# Patient Record
Sex: Male | Born: 1965 | Race: White | Hispanic: No | Marital: Married | Smoking: Former smoker
Health system: Southern US, Community
[De-identification: ages and names within clinical notes are randomized; demographics above are authoritative.]

## PROBLEM LIST (undated history)

## (undated) DIAGNOSIS — G4730 Sleep apnea, unspecified: Secondary | ICD-10-CM

## (undated) DIAGNOSIS — E7800 Pure hypercholesterolemia, unspecified: Secondary | ICD-10-CM

## (undated) HISTORY — PX: KNEE SURGERY: SHX244

## (undated) HISTORY — PX: HAND SURGERY: SHX662

---

## 2012-10-10 ENCOUNTER — Emergency Department (HOSPITAL_COMMUNITY): Payer: BC Managed Care – PPO

## 2012-10-10 ENCOUNTER — Emergency Department (HOSPITAL_COMMUNITY)
Admission: EM | Admit: 2012-10-10 | Discharge: 2012-10-10 | Disposition: A | Discharge status: Home / Self Care | Payer: BC Managed Care – PPO | Attending: Emergency Medicine | Admitting: Emergency Medicine

## 2012-10-10 ENCOUNTER — Encounter (HOSPITAL_COMMUNITY): Payer: Self-pay | Admitting: *Deleted

## 2012-10-10 DIAGNOSIS — F17200 Nicotine dependence, unspecified, uncomplicated: Secondary | ICD-10-CM | POA: Insufficient documentation

## 2012-10-10 DIAGNOSIS — Z79899 Other long term (current) drug therapy: Secondary | ICD-10-CM | POA: Insufficient documentation

## 2012-10-10 DIAGNOSIS — R4589 Other symptoms and signs involving emotional state: Secondary | ICD-10-CM | POA: Insufficient documentation

## 2012-10-10 DIAGNOSIS — E7800 Pure hypercholesterolemia, unspecified: Secondary | ICD-10-CM | POA: Insufficient documentation

## 2012-10-10 DIAGNOSIS — F411 Generalized anxiety disorder: Secondary | ICD-10-CM

## 2012-10-10 DIAGNOSIS — R0602 Shortness of breath: Secondary | ICD-10-CM | POA: Insufficient documentation

## 2012-10-10 DIAGNOSIS — R079 Chest pain, unspecified: Secondary | ICD-10-CM | POA: Insufficient documentation

## 2012-10-10 DIAGNOSIS — R42 Dizziness and giddiness: Secondary | ICD-10-CM | POA: Insufficient documentation

## 2012-10-10 HISTORY — DX: Pure hypercholesterolemia, unspecified: E78.00

## 2012-10-10 LAB — POCT I-STAT TROPONIN I: Troponin i, poc: 0 ng/mL (ref 0.00–0.08)

## 2012-10-10 LAB — BASIC METABOLIC PANEL
Calcium: 9.4 mg/dL (ref 8.4–10.5)
GFR calc non Af Amer: 78 mL/min — ABNORMAL LOW (ref >90)
Glucose, Bld: 142 mg/dL — ABNORMAL HIGH (ref 70–99)
Sodium: 142 mEq/L (ref 135–145)

## 2012-10-10 MED ORDER — ASPIRIN 81 MG PO CHEW
324.0000 mg | CHEWABLE_TABLET | Freq: Once | ORAL | Status: AC
  Administered 2012-10-10: 324 mg via ORAL
  Filled 2012-10-10: qty 4

## 2012-10-10 MED ORDER — IOHEXOL 350 MG/ML SOLN
100.0000 mL | Freq: Once | INTRAVENOUS | Status: AC | PRN
  Administered 2012-10-10: 100 mL via INTRAVENOUS

## 2012-10-10 NOTE — ED Provider Notes (Addendum)
History     CSN: [id number]  Arrival date & time 10/10/12  0628   None     Chief Complaint  Patient presents with  . Chest Pain  . Shortness of Breath  . Dizziness   HPI Brad Luna is a 47 y.o. male history of high cholesterol, daily smoker and smokeless tobacco user who presents with acute onset of chest pain.  Patient admits to a lot of stress recently and was ruminating on the things he needed to do early this morning before he got to work.  He had some tnigling in bilateral fingers at that time. 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 Is also associated with a pounding headache in the frontal region head has somewhat eased up. Patient is currently chest pain-free. Denies any recent illness, fever, chills, productive cough, hemoptysis, history of venous thromboembolic disease, unilateral leg swelling, abdominal pain, weakness.   Past Medical History  Diagnosis Date  . High cholesterol     Past Surgical History  Procedure Laterality Date  . Knee surgery      History reviewed. No pertinent family history.  History  Substance Use Topics  . Smoking status: Current Every Day Smoker    Types: Cigarettes  . Smokeless tobacco: Not on file  . Alcohol Use: Yes      Review of Systems At least 10pt or greater review of systems completed and are negative except where specified in the HPI.  Allergies  Review of patient's allergies indicates no known allergies.  Home Medications   Current Outpatient Rx  Name  Route  Sig  Dispense  Refill  . SIMVASTATIN PO   Oral   Take by mouth.           There were no vitals taken for this visit.  Physical Exam  Nursing notes reviewed.  Electronic medical record  reviewed. VITAL SIGNS:   Filed Vitals:   10/10/12 0647  BP: 135/75  Pulse: 108  Temp: 98.7 F (37.1 C)  TempSrc: Oral  Resp: 18  Height: 6\' 3"  (1.905 m)  Weight: 270 lb (122.471 kg)  SpO2: 95%   CONSTITUTIONAL: Awake, oriented, appears non-toxic, appears anxious HENT: Atraumatic, normocephalic, oral mucosa pink and moist, airway patent. Nares patent without drainage. External ears normal. EYES: Conjunctiva clear, EOMI, PERRLA NECK: Trachea midline, non-tender, supple CARDIOVASCULAR: Normal heart rate, Normal rhythm, No murmurs, rubs, gallops PULMONARY/CHEST: Clear to auscultation, no rhonchi, wheezes, or rales. Symmetrical breath sounds. Non-tender. ABDOMINAL: Non-distended, soft, non-tender - no rebound or guarding.  BS normal. NEUROLOGIC: Non-focal, moving all four extremities, no gross sensory or motor deficits. EXTREMITIES: No clubbing, cyanosis, or edema SKIN: Warm, Dry, No erythema, No rash  ED Course  Procedures (including critical care time)  Date: 10/10/2012  Rate: 77  Rhythm: normal sinus rhythm  QRS Axis: Questionable left axis deviation  Intervals: normal  ST/T Wave abnormalities: normal  Conduction Disutrbances: none  Narrative Interpretation: Low voltage QRS, borderline EKG, no acute ischemia or infarction, no right heart strain   Labs Reviewed  BASIC METABOLIC PANEL - Abnormal; Notable for the following:    Glucose, Bld 142 (*)    GFR calc non Af Amer 78 (*)    All other components within normal limits  PRO B NATRIURETIC PEPTIDE  POCT I-STAT TROPONIN I   Ct Angio Chest Pe W/cm &/or Wo Cm  10/10/2012   *RADIOLOGY REPORT*  Clinical Data: 47 year old male with hypoxia, dizziness, tachycardia, shortness of breath.  CT ANGIOGRAPHY CHEST  Technique:  Multidetector CT imaging of the chest using the standard protocol during bolus administration of intravenous contrast. Multiplanar reconstructed images including MIPs were obtained and reviewed to evaluate the  vascular anatomy.  Contrast: OMNIPAQUE IOHEXOL 350 MG/ML SOLN  Comparison: None.  Findings: Suboptimal contrast bolus timing in the pulmonary arterial tree.  No central pulmonary artery filling defect.  No hilar pulmonary artery filling defect.  Only mild respiratory motion artifact.  No convincing pulmonary artery thrombus.  Negative thoracic inlet.  Negative visualized aorta.  No pericardial effusion.  No definite pleural effusion.  No mediastinal lymphadenopathy.  Major airways are patent.  The lung bases are not entirely included.  Minor dependent atelectasis.  5-6 mm medial basal segment right lower lobe pulmonary nodule on series 6 image 51. Small III 4 mm subpleural nodule at the right lower lobe lateral basal segment (image 66).  Negative lung parenchyma elsewhere.  No acute osseous abnormality identified.  Negative visualized upper abdominal viscera.  IMPRESSION: 1.  Suboptimal contrast bolus timing but no convincing evidence of acute pulmonary embolus. 2.  Two right lower lobe pulmonary nodules measuring up to 6 mm. Follow-up chest CT is recommended in 1 year (Radiology 2005; 540:981-191 "Guidelines for Management of Small Pulmonary Nodules Detected on CT Scans:  A Statement from the Fleischner Society").   Original Report Authenticated By: Erskine Speed, M.D.     1. Chest pain   2. Shortness of breath   3. Dizzy   4. Anxiety in acute stress reaction       MDM  Brad Luna is a 47 y.o. male presenting with a constellation of symptoms, starting early this morning with some tingling and bilateral fingertips, as the day progressed, he also had acute onset of chest pain which lasted for approximately 10 minutes, felt like pressure, associated with dizziness, graying of his vision, dizziness which resolved without any interventions.  This is also associated with headache.  Symptoms are currently resolved. Patient does admit to being under stress.  Patient's symptom constellation suggestive of  panic and anxiety, however feel like this should be a diagnosis of exclusion. Initially, patient is tachycardic, with some borderline oxygen saturations - obtain a CT with PE protocol to rule out pulmonary embolism as PE was as likely as any other diagnosis, he was tachycardic and had O2 saturations in the low 90s on 2 L of oxygen but never required oxygen before. CT of his chest shows no acute pulmonary embolism, no acute intrathoracic structural abnormalities. Patient does have 2 right lower lobe pulmonary nodules, patient was informed of these nodules in the knee for followup with his primary care physician.  Patient's EKG is nonischemic, troponin is negative.  Patient's heart score is a 2.  I think this patient is of sufficient low risk to be followed up as an outpatient. Likewise there is no evidence of pericarditis, pneumothorax, pneumonia.    Patient's vital signs on reassessment are within normal limits. Patient is urged to followup with his primary care physician for further assessment, quit smoking.  I explained the diagnosis and have given explicit precautions to return to the ER including chest pains or any other new or worsening symptoms. The patient understands and accepts the medical plan as  it's been dictated and I have answered their questions. Discharge instructions concerning home care and prescriptions have been given.  The patient is STABLE and is discharged to home in good condition.    Jones Skene, MD 10/10/12 [id number]

## 2012-10-10 NOTE — ED Notes (Signed)
Pt c/o centralized chest pain described as chest pressure. Pt states he started seeing gray. Pt also c/o sob, lightheadedness, and a pounding headache.

## 2012-10-10 NOTE — ED Notes (Signed)
Patient with no complaints at this time. Respirations even and unlabored. Skin warm/dry. Discharge instructions reviewed with patient at this time. Patient given opportunity to voice concerns/ask questions. IV removed per policy and band-aid applied to site. Patient discharged at this time and left Emergency Department with steady gait.  

## 2012-10-15 DIAGNOSIS — R079 Chest pain, unspecified: Secondary | ICD-10-CM

## 2013-11-21 ENCOUNTER — Encounter (HOSPITAL_COMMUNITY): Payer: Self-pay | Admitting: Emergency Medicine

## 2013-11-21 ENCOUNTER — Emergency Department (HOSPITAL_COMMUNITY): Payer: BC Managed Care – PPO

## 2013-11-21 ENCOUNTER — Emergency Department (HOSPITAL_COMMUNITY)
Admission: EM | Admit: 2013-11-21 | Discharge: 2013-11-21 | Disposition: A | Discharge status: Home / Self Care | Payer: BC Managed Care – PPO | Attending: Emergency Medicine | Admitting: Emergency Medicine

## 2013-11-21 DIAGNOSIS — R55 Syncope and collapse: Secondary | ICD-10-CM

## 2013-11-21 DIAGNOSIS — Z79899 Other long term (current) drug therapy: Secondary | ICD-10-CM | POA: Insufficient documentation

## 2013-11-21 DIAGNOSIS — F17200 Nicotine dependence, unspecified, uncomplicated: Secondary | ICD-10-CM | POA: Insufficient documentation

## 2013-11-21 DIAGNOSIS — E7800 Pure hypercholesterolemia, unspecified: Secondary | ICD-10-CM | POA: Insufficient documentation

## 2013-11-21 LAB — COMPREHENSIVE METABOLIC PANEL
ALK PHOS: 63 U/L (ref 39–117)
ALT: 20 U/L (ref 0–53)
AST: 21 U/L (ref 0–37)
Albumin: 3.8 g/dL (ref 3.5–5.2)
Anion gap: 13 (ref 5–15)
BILIRUBIN TOTAL: 0.4 mg/dL (ref 0.3–1.2)
BUN: 17 mg/dL (ref 6–23)
CHLORIDE: 105 meq/L (ref 96–112)
CO2: 23 meq/L (ref 19–32)
Calcium: 8.6 mg/dL (ref 8.4–10.5)
Creatinine, Ser: 1.03 mg/dL (ref 0.50–1.35)
GFR, EST NON AFRICAN AMERICAN: 84 mL/min — AB (ref >90)
GLUCOSE: 105 mg/dL — AB (ref 70–99)
Potassium: 4.1 mEq/L (ref 3.7–5.3)
SODIUM: 141 meq/L (ref 137–147)
Total Protein: 6.5 g/dL (ref 6.0–8.3)

## 2013-11-21 LAB — RAPID URINE DRUG SCREEN, HOSP PERFORMED
AMPHETAMINES: NOT DETECTED
BENZODIAZEPINES: NOT DETECTED
Barbiturates: NOT DETECTED
Cocaine: NOT DETECTED
Opiates: NOT DETECTED
Tetrahydrocannabinol: NOT DETECTED

## 2013-11-21 LAB — CBC WITH DIFFERENTIAL/PLATELET
BASOS ABS: 0 10*3/uL (ref 0.0–0.1)
Basophils Relative: 0 % (ref 0–1)
EOS PCT: 1 % (ref 0–5)
Eosinophils Absolute: 0.1 10*3/uL (ref 0.0–0.7)
HEMATOCRIT: 46.2 % (ref 39.0–52.0)
Hemoglobin: 16 g/dL (ref 13.0–17.0)
LYMPHS ABS: 1.1 10*3/uL (ref 0.7–4.0)
LYMPHS PCT: 17 % (ref 12–46)
MCH: 31.4 pg (ref 26.0–34.0)
MCHC: 34.6 g/dL (ref 30.0–36.0)
MCV: 90.6 fL (ref 78.0–100.0)
Monocytes Absolute: 0.3 10*3/uL (ref 0.1–1.0)
Monocytes Relative: 5 % (ref 3–12)
NEUTROS ABS: 5.1 10*3/uL (ref 1.7–7.7)
Neutrophils Relative %: 77 % (ref 43–77)
PLATELETS: 159 10*3/uL (ref 150–400)
RBC: 5.1 MIL/uL (ref 4.22–5.81)
RDW: 13.3 % (ref 11.5–15.5)
WBC: 6.6 10*3/uL (ref 4.0–10.5)

## 2013-11-21 LAB — URINALYSIS, ROUTINE W REFLEX MICROSCOPIC
BILIRUBIN URINE: NEGATIVE
Glucose, UA: NEGATIVE mg/dL
Hgb urine dipstick: NEGATIVE
KETONES UR: NEGATIVE mg/dL
Leukocytes, UA: NEGATIVE
Nitrite: NEGATIVE
PH: 7 (ref 5.0–8.0)
PROTEIN: NEGATIVE mg/dL
Specific Gravity, Urine: 1.01 (ref 1.005–1.030)
Urobilinogen, UA: 0.2 mg/dL (ref 0.0–1.0)

## 2013-11-21 LAB — TROPONIN I: Troponin I: <0.3 ng/mL (ref <0.30)

## 2013-11-21 MED ORDER — SODIUM CHLORIDE 0.9 % IV BOLUS (SEPSIS)
1000.0000 mL | Freq: Once | INTRAVENOUS | Status: AC
  Administered 2013-11-21: 1000 mL via INTRAVENOUS

## 2013-11-21 NOTE — ED Notes (Signed)
Patient with no complaints at this time. Respirations even and unlabored. Skin warm/dry. Discharge instructions reviewed with patient at this time. Patient given opportunity to voice concerns/ask questions. IV removed per policy and band-aid applied to site. Patient discharged at this time and left Emergency Department with steady gait.  

## 2013-11-21 NOTE — ED Notes (Signed)
Patient arrives via EMS from home with c/o loss of consciousness. Patient was getting ready for work and stated to his wife that he didn't feel well and sat down. Wife states he passed out and wife assisted him to floor. Denies pain. Does not recall event other than sitting down.

## 2013-11-21 NOTE — ED Notes (Signed)
Patient was unconscious until EMS arrived to house. Approximately 7 minutes.

## 2013-11-21 NOTE — ED Provider Notes (Signed)
CSN: [id number]     Arrival date & time 11/21/13  1715 History   First MD Initiated Contact with Patient 11/21/13 1734   This chart was scribed for Maudry Diego, MD by Rosary Lively, ED scribe. This patient was seen in room [facility]12/[facility]12 and the patient's care was started at 5:38 PM.    Chief Complaint  Patient presents with  . Loss of Consciousness   Patient is a 48 y.o. male presenting with syncope. The history is provided by the patient and a relative. No language interpreter was used.  Loss of Consciousness Episode history:  Single Most recent episode:  Today Chronicity:  New Associated symptoms: no chest pain, no headaches and no seizures   Associated symptoms comment:  Pt felt light headed  HPI Comments:  Brad Luna is a 48 y.o. male who presents to the Emergency Department complaining of LOC, with associated symptoms of light headedness prior to episode of LOC. Relative reports that Pt eyes deviated to the right during episode of LOC, but pt did not shake. Pt denies pain or recent illness. Pt reports that he is a smoker, and that his father died of lung cancer. Pt reports that he had knee surgery on both knees. Pt states that his PCP is at [facility].    Past Medical History  Diagnosis Date  . High cholesterol    Past Surgical History  Procedure Laterality Date  . Knee surgery     No family history on file. History  Substance Use Topics  . Smoking status: Current Every Day Smoker    Types: Cigarettes  . Smokeless tobacco: Not on file  . Alcohol Use: Yes    Review of Systems  Constitutional: Negative for appetite change and fatigue.  HENT: Negative for congestion, ear discharge and sinus pressure.   Eyes: Negative for discharge.  Respiratory: Negative for cough.   Cardiovascular: Positive for syncope. Negative for chest pain.  Gastrointestinal: Negative for abdominal pain and diarrhea.  Genitourinary: Negative for frequency and hematuria.  Musculoskeletal:  Negative for back pain.  Skin: Negative for rash.  Neurological: Positive for syncope and light-headedness. Negative for seizures and headaches.  Psychiatric/Behavioral: Negative for hallucinations.      Allergies  Review of patient's allergies indicates no known allergies.  Home Medications   Prior to Admission medications   Medication Sig Start Date End Date Taking? Authorizing Provider  acetaminophen (TYLENOL) 500 MG tablet Take 1,000 mg by mouth every 6 (six) hours as needed for pain.    Historical Provider, MD  ibuprofen (ADVIL,MOTRIN) 800 MG tablet Take 800 mg by mouth every 8 (eight) hours as needed for pain.    Historical Provider, MD  Omeprazole (PRILOSEC PO) Take 1 tablet by mouth daily as needed (acid).    Historical Provider, MD  SIMVASTATIN PO Take by mouth.    Historical Provider, MD   BP 129/95  Pulse 85  Temp(Src) 97.7 F (36.5 C) (Oral)  Resp 18  Ht 6\' 3"  (1.905 m)  Wt 258 lb (117.028 kg)  BMI 32.25 kg/m2  SpO2 93% Physical Exam  Constitutional: He is oriented to person, place, and time. He appears well-developed.  HENT:  Head: Normocephalic.  Eyes: Conjunctivae and EOM are normal. No scleral icterus.  Neck: Neck supple. No thyromegaly present.  Cardiovascular: Normal rate and regular rhythm.  Exam reveals no gallop and no friction rub.   No murmur heard. Pulmonary/Chest: No stridor. He has no wheezes. He has no rales. He exhibits  no tenderness.  Abdominal: He exhibits no distension. There is no tenderness. There is no rebound.  Musculoskeletal: Normal range of motion. He exhibits no edema.  Lymphadenopathy:    He has no cervical adenopathy.  Neurological: He is oriented to person, place, and time. He exhibits normal muscle tone. Coordination normal.  Skin: No rash noted. No erythema.  Psychiatric: He has a normal mood and affect. His behavior is normal.    ED Course  Procedures  DIAGNOSTIC STUDIES: Oxygen Saturation is 93% on RA, low by my  interpretation.  COORDINATION OF CARE: 5:41 PM-Discussed treatment plan with pt at bedside and pt agreed to plan. Results for orders placed during the hospital encounter of 11/21/13  CBC WITH DIFFERENTIAL      Result Value Ref Range   WBC 6.6  4.0 - 10.5 K/uL   RBC 5.10  4.22 - 5.81 MIL/uL   Hemoglobin 16.0  13.0 - 17.0 g/dL   HCT 46.2  39.0 - 52.0 %   MCV 90.6  78.0 - 100.0 fL   MCH 31.4  26.0 - 34.0 pg   MCHC 34.6  30.0 - 36.0 g/dL   RDW 13.3  11.5 - 15.5 %   Platelets 159  150 - 400 K/uL   Neutrophils Relative % 77  43 - 77 %   Neutro Abs 5.1  1.7 - 7.7 K/uL   Lymphocytes Relative 17  12 - 46 %   Lymphs Abs 1.1  0.7 - 4.0 K/uL   Monocytes Relative 5  3 - 12 %   Monocytes Absolute 0.3  0.1 - 1.0 K/uL   Eosinophils Relative 1  0 - 5 %   Eosinophils Absolute 0.1  0.0 - 0.7 K/uL   Basophils Relative 0  0 - 1 %   Basophils Absolute 0.0  0.0 - 0.1 K/uL  COMPREHENSIVE METABOLIC PANEL      Result Value Ref Range   Sodium 141  137 - 147 mEq/L   Potassium 4.1  3.7 - 5.3 mEq/L   Chloride 105  96 - 112 mEq/L   CO2 23  19 - 32 mEq/L   Glucose, Bld 105 (*) 70 - 99 mg/dL   BUN 17  6 - 23 mg/dL   Creatinine, Ser 1.03  0.50 - 1.35 mg/dL   Calcium 8.6  8.4 - 10.5 mg/dL   Total Protein 6.5  6.0 - 8.3 g/dL   Albumin 3.8  3.5 - 5.2 g/dL   AST 21  0 - 37 U/L   ALT 20  0 - 53 U/L   Alkaline Phosphatase 63  39 - 117 U/L   Total Bilirubin 0.4  0.3 - 1.2 mg/dL   GFR calc non Af Amer 84 (*) >90 mL/min   GFR calc Af Amer >90  >90 mL/min   Anion gap 13  5 - 15  TROPONIN I      Result Value Ref Range   Troponin I <0.30  <0.30 ng/mL  URINALYSIS, ROUTINE W REFLEX MICROSCOPIC      Result Value Ref Range   Color, Urine YELLOW  YELLOW   APPearance CLEAR  CLEAR   Specific Gravity, Urine 1.010  1.005 - 1.030   pH 7.0  5.0 - 8.0   Glucose, UA NEGATIVE  NEGATIVE mg/dL   Hgb urine dipstick NEGATIVE  NEGATIVE   Bilirubin Urine NEGATIVE  NEGATIVE   Ketones, ur NEGATIVE  NEGATIVE mg/dL   Protein,  ur NEGATIVE  NEGATIVE mg/dL   Urobilinogen, UA  0.2  0.0 - 1.0 mg/dL   Nitrite NEGATIVE  NEGATIVE   Leukocytes, UA NEGATIVE  NEGATIVE  URINE RAPID DRUG SCREEN (HOSP PERFORMED)      Result Value Ref Range   Opiates NONE DETECTED  NONE DETECTED   Cocaine NONE DETECTED  NONE DETECTED   Benzodiazepines NONE DETECTED  NONE DETECTED   Amphetamines NONE DETECTED  NONE DETECTED   Tetrahydrocannabinol NONE DETECTED  NONE DETECTED   Barbiturates NONE DETECTED  NONE DETECTED      EKG Interpretation None      MDM   Final diagnoses:  None   Syncope,  Nl studies pt to follow up with pcp,  Also follow up with pcp about small lung nodule  The chart was scribed for me under my direct supervision.  I personally performed the history, physical, and medical decision making and all procedures in the evaluation of this patient.Maudry Diego, MD 11/21/13 2100

## 2013-11-21 NOTE — Discharge Instructions (Signed)
Follow up with your md next week for recheck.  Return if any problems

## 2015-03-13 ENCOUNTER — Encounter (HOSPITAL_COMMUNITY): Payer: Self-pay | Admitting: *Deleted

## 2015-03-13 ENCOUNTER — Emergency Department (HOSPITAL_COMMUNITY): Payer: Worker's Compensation

## 2015-03-13 ENCOUNTER — Emergency Department (HOSPITAL_COMMUNITY)
Admission: EM | Admit: 2015-03-13 | Discharge: 2015-03-13 | Disposition: A | Discharge status: Home / Self Care | Payer: Worker's Compensation | Attending: Emergency Medicine | Admitting: Emergency Medicine

## 2015-03-13 DIAGNOSIS — Y998 Other external cause status: Secondary | ICD-10-CM | POA: Diagnosis not present

## 2015-03-13 DIAGNOSIS — Y9389 Activity, other specified: Secondary | ICD-10-CM | POA: Diagnosis not present

## 2015-03-13 DIAGNOSIS — E7800 Pure hypercholesterolemia, unspecified: Secondary | ICD-10-CM | POA: Insufficient documentation

## 2015-03-13 DIAGNOSIS — Z79899 Other long term (current) drug therapy: Secondary | ICD-10-CM | POA: Diagnosis not present

## 2015-03-13 DIAGNOSIS — S63602A Unspecified sprain of left thumb, initial encounter: Secondary | ICD-10-CM | POA: Diagnosis not present

## 2015-03-13 DIAGNOSIS — X58XXXA Exposure to other specified factors, initial encounter: Secondary | ICD-10-CM | POA: Diagnosis not present

## 2015-03-13 DIAGNOSIS — Y9289 Other specified places as the place of occurrence of the external cause: Secondary | ICD-10-CM | POA: Diagnosis not present

## 2015-03-13 DIAGNOSIS — S6992XA Unspecified injury of left wrist, hand and finger(s), initial encounter: Secondary | ICD-10-CM | POA: Diagnosis present

## 2015-03-13 DIAGNOSIS — Z720 Tobacco use: Secondary | ICD-10-CM | POA: Insufficient documentation

## 2015-03-13 NOTE — ED Notes (Signed)
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

## 2015-03-13 NOTE — Discharge Instructions (Signed)
Finger Sprain A finger sprain is a tear in one of the strong, fibrous tissues that connect the bones (ligaments) in your finger. The severity of the sprain depends on how much of the ligament is torn. The tear can be either partial or complete. CAUSES  Often, sprains are a result of a fall or accident. If you extend your hands to catch an object or to protect yourself, the force of the impact causes the fibers of your ligament to stretch too much. This excess tension causes the fibers of your ligament to tear. SYMPTOMS  You may have some loss of motion in your finger. Other symptoms include:  Bruising.  Tenderness.  Swelling. DIAGNOSIS  In order to diagnose finger sprain, your caregiver will physically examine your finger or thumb to determine how torn the ligament is. Your caregiver may also suggest an X-ray exam of your finger to make sure no bones are broken. TREATMENT  If your ligament is only partially torn, treatment usually involves keeping the finger in a fixed position (immobilization) for a short period. To do this, your caregiver will apply a bandage, cast, or splint to keep your finger from moving until it heals. For a partially torn ligament, the healing process usually takes 2 to 3 weeks. If your ligament is completely torn, you may need surgery to reconnect the ligament to the bone. After surgery a cast or splint will be applied and will need to stay on your finger or thumb for 4 to 6 weeks while your ligament heals. HOME CARE INSTRUCTIONS  Keep your injured finger elevated, when possible, to decrease swelling.  To ease pain and swelling, apply ice to your joint twice a day, for 2 to 3 days:  Put ice in a plastic bag.  Place a towel between your skin and the bag.  Leave the ice on for 15 minutes.  Only take over-the-counter or prescription medicine for pain as directed by your caregiver.  Do not wear rings on your injured finger.  Do not leave your finger unprotected  until pain and stiffness go away (usually 3 to 4 weeks).  Do not allow your cast or splint to get wet. Cover your cast or splint with a plastic bag when you shower or bathe. Do not swim.  Your caregiver may suggest special exercises for you to do during your recovery to prevent or limit permanent stiffness. SEEK IMMEDIATE MEDICAL CARE IF:  Your cast or splint becomes damaged.  Your pain becomes worse rather than better. MAKE SURE YOU:  Understand these instructions.  Will watch your condition.  Will get help right away if you are not doing well or get worse.   This information is not intended to replace advice given to you by your health care provider. Make sure you discuss any questions you have with your health care provider.   Document Released: 06/01/2004 Document Revised: 05/15/2014 Document Reviewed: 12/26/2010 Elsevier Interactive Patient Education 2016 [employer]  

## 2015-03-13 NOTE — ED Provider Notes (Signed)
CSN: [id number]     Arrival date & time 03/13/15  1746 History   None    Chief Complaint  Patient presents with  . Finger Injury     (Consider location/radiation/quality/duration/timing/severity/associated sxs/prior Treatment) Patient is a 49 y.o. male presenting with hand pain. The history is provided by the patient. No language interpreter was used.  Hand Pain This is a new problem. The current episode started today. The problem occurs constantly. The problem has been unchanged. Associated symptoms include joint swelling and myalgias. Nothing aggravates the symptoms. He has tried nothing for the symptoms. The treatment provided no relief.   Pt complains of pain in his left thumb.  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  Pt thinks thumb may have been twisted, now painful,  Loose, Past Medical History  Diagnosis Date  . High cholesterol    Past Surgical History  Procedure Laterality Date  . Knee surgery     No family history on file. Social History  Substance Use Topics  . Smoking status: Current Every Day Smoker    Types: Cigarettes  . Smokeless tobacco: None  . Alcohol Use: Yes     Comment: Occ    Review of Systems  Musculoskeletal: Positive for myalgias and joint swelling.  All other systems reviewed and are negative.     Allergies  Review of patient's allergies indicates no known allergies.  Home Medications   Prior to Admission medications   Medication Sig Start Date End Date Taking? Authorizing Provider  acetaminophen (TYLENOL) 500 MG tablet Take 1,000 mg by mouth every 6 (six) hours as needed for pain.    Historical Provider, MD  buPROPion (WELLBUTRIN SR) 150 MG 12 hr tablet Take 150 mg by mouth 2 (two) times daily. 10/08/13   Historical Provider, MD  ibuprofen (ADVIL,MOTRIN) 200 MG tablet Take 200 mg by mouth every 6 (six) hours as needed for mild pain or moderate pain.    Historical Provider, MD  simvastatin (ZOCOR) 10 MG tablet Take 10 mg  by mouth at bedtime.    Historical Provider, MD   BP 150/96 mmHg  Pulse 106  Temp(Src) 98.2 F (36.8 C) (Oral)  Resp 16  Ht 6\' 3"  (1.905 m)  Wt 265 lb (120.203 kg)  BMI 33.12 kg/m2  SpO2 99% Physical Exam  Constitutional: He is oriented to person, place, and time. He appears well-developed and well-nourished.  Musculoskeletal: He exhibits tenderness.  Slight swelling at mcp.  From ns and nv intact.  Pt has increased lateral laxity compared with right.    Neurological: He is alert and oriented to person, place, and time. He has normal reflexes.  Skin: Skin is warm.  Psychiatric: He has a normal mood and affect.  Nursing note and vitals reviewed.   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 EXAM: LEFT THUMB 2+V COMPARISON:  None. FINDINGS: There is no evidence of fracture or dislocation. There is no evidence of arthropathy or other focal bone abnormality. Soft tissues are unremarkable IMPRESSION: Negative. Electronically Signed   By: Kathreen Devoid   On: 03/13/2015 18:14   I have personally reviewed and evaluated these images and lab results as part of my medical decision-making.   EKG Interpretation None      MDM   Final diagnoses:  Thumb  sprain, left, initial encounter    Pt placed in splint. Pt advise to follow up with Dr. Burney Gauze for evaluation next week.    Hollace Kinnier [place], PA-C 03/13/15 2010  Virgel Manifold, MD 03/13/15 [phone]

## 2015-06-08 NOTE — ED Notes (Signed)
Work note faxed to ED from Dr Edrick Oh office. Called pt to inform him and to see if he needed a copy. He stated he had a copy and to shred our copy. Copy of work noted placed in shred box.

## 2015-11-17 ENCOUNTER — Encounter (INDEPENDENT_AMBULATORY_CARE_PROVIDER_SITE_OTHER): Payer: Self-pay | Admitting: *Deleted

## 2015-12-15 ENCOUNTER — Encounter (INDEPENDENT_AMBULATORY_CARE_PROVIDER_SITE_OTHER): Payer: Self-pay

## 2015-12-15 ENCOUNTER — Encounter (INDEPENDENT_AMBULATORY_CARE_PROVIDER_SITE_OTHER): Payer: Self-pay | Admitting: *Deleted

## 2015-12-16 ENCOUNTER — Other Ambulatory Visit (INDEPENDENT_AMBULATORY_CARE_PROVIDER_SITE_OTHER): Payer: Self-pay | Admitting: *Deleted

## 2015-12-16 DIAGNOSIS — Z1211 Encounter for screening for malignant neoplasm of colon: Secondary | ICD-10-CM

## 2016-02-10 ENCOUNTER — Telehealth (INDEPENDENT_AMBULATORY_CARE_PROVIDER_SITE_OTHER): Payer: Self-pay | Admitting: *Deleted

## 2016-02-10 ENCOUNTER — Encounter (INDEPENDENT_AMBULATORY_CARE_PROVIDER_SITE_OTHER): Payer: Self-pay | Admitting: *Deleted

## 2016-02-10 NOTE — Telephone Encounter (Signed)
Patient needs trilyte 

## 2016-02-11 MED ORDER — PEG 3350-KCL-NA BICARB-NACL 420 G PO SOLR: 4000.0000 mL | Freq: Once | ORAL | 0 refills | Status: AC

## 2016-03-01 ENCOUNTER — Telehealth (INDEPENDENT_AMBULATORY_CARE_PROVIDER_SITE_OTHER): Payer: Self-pay | Admitting: *Deleted

## 2016-03-01 NOTE — Telephone Encounter (Signed)
Referring MD/PCP: daniel   Procedure: Brad Luna  Reason/Indication:  tcs  Has patient had this procedure before?  screening  If so, when, by whom and where?    Is there a family history of colon cancer?  no  Who?  What age when diagnosed?    Is patient diabetic?   no      Does patient have prosthetic heart valve or mechanical valve?  no  Do you have a pacemaker?  no  Has patient ever had endocarditis? no  Has patient had joint replacement within last 12 months?  no  Does patient tend to be constipated or take laxatives? no  Does patient have a history of alcohol/drug use?  no  Is patient on Coumadin, Plavix and/or Aspirin? no  Medications: simvastatin 40 mg daily, bupropion 150 mg bid  Allergies: nkda  Medication Adjustment:   Procedure date & time: 03/23/16 at 1030

## 2016-03-01 NOTE — Telephone Encounter (Signed)
agree

## 2016-03-23 ENCOUNTER — Ambulatory Visit (HOSPITAL_COMMUNITY)
Admission: RE | Admit: 2016-03-23 | Discharge: 2016-03-23 | Disposition: A | Discharge status: Home / Self Care | Payer: BLUE CROSS/BLUE SHIELD | Source: Ambulatory Visit | Attending: Internal Medicine | Admitting: Internal Medicine

## 2016-03-23 ENCOUNTER — Encounter (HOSPITAL_COMMUNITY)
Admission: RE | Disposition: A | Discharge status: Home / Self Care | Payer: Self-pay | Source: Ambulatory Visit | Attending: Internal Medicine

## 2016-03-23 ENCOUNTER — Encounter (HOSPITAL_COMMUNITY): Payer: Self-pay | Admitting: *Deleted

## 2016-03-23 DIAGNOSIS — D122 Benign neoplasm of ascending colon: Secondary | ICD-10-CM | POA: Insufficient documentation

## 2016-03-23 DIAGNOSIS — Z79899 Other long term (current) drug therapy: Secondary | ICD-10-CM | POA: Insufficient documentation

## 2016-03-23 DIAGNOSIS — Z1211 Encounter for screening for malignant neoplasm of colon: Secondary | ICD-10-CM

## 2016-03-23 DIAGNOSIS — K648 Other hemorrhoids: Secondary | ICD-10-CM

## 2016-03-23 DIAGNOSIS — E7800 Pure hypercholesterolemia, unspecified: Secondary | ICD-10-CM | POA: Diagnosis not present

## 2016-03-23 DIAGNOSIS — F17210 Nicotine dependence, cigarettes, uncomplicated: Secondary | ICD-10-CM | POA: Insufficient documentation

## 2016-03-23 DIAGNOSIS — K5730 Diverticulosis of large intestine without perforation or abscess without bleeding: Secondary | ICD-10-CM | POA: Insufficient documentation

## 2016-03-23 HISTORY — PX: COLONOSCOPY: SHX5424

## 2016-03-23 HISTORY — DX: Pure hypercholesterolemia, unspecified: E78.00

## 2016-03-23 HISTORY — DX: Sleep apnea, unspecified: G47.30

## 2016-03-23 SURGERY — COLONOSCOPY
Anesthesia: Moderate Sedation

## 2016-03-23 MED ORDER — SIMETHICONE 40 MG/0.6ML PO SUSP
ORAL | Status: DC | PRN
  Administered 2016-03-23: 10:00:00

## 2016-03-23 MED ORDER — MIDAZOLAM HCL 5 MG/5ML IJ SOLN
INTRAMUSCULAR | Status: AC
  Filled 2016-03-23: qty 10

## 2016-03-23 MED ORDER — SODIUM CHLORIDE 0.9 % IV SOLN
INTRAVENOUS | Status: DC
  Administered 2016-03-23: 10:00:00 via INTRAVENOUS

## 2016-03-23 MED ORDER — MEPERIDINE HCL 50 MG/ML IJ SOLN
INTRAMUSCULAR | Status: DC | PRN
  Administered 2016-03-23 (×2): 25 mg via INTRAVENOUS

## 2016-03-23 MED ORDER — MIDAZOLAM HCL 5 MG/5ML IJ SOLN
INTRAMUSCULAR | Status: DC | PRN
Start: 2016-03-23 — End: 2016-03-23
  Administered 2016-03-23: 1 mg via INTRAVENOUS
  Administered 2016-03-23 (×2): 2 mg via INTRAVENOUS
  Administered 2016-03-23: 3 mg via INTRAVENOUS

## 2016-03-23 MED ORDER — MEPERIDINE HCL 50 MG/ML IJ SOLN
INTRAMUSCULAR | Status: AC
  Filled 2016-03-23: qty 1

## 2016-03-23 NOTE — H&P (Signed)
Brad Luna is an 50 y.o. male.   Chief Complaint: Patient is here for colonoscopy. HPI: Patient is 50 year old Caucasian male who is here for screening colonoscopy. He denies abdominal pain change in bowel habits or rectal bleeding. Family history is negative for CRC.  Past Medical History:  Diagnosis Date  . High cholesterol   . Hypercholesteremia   . Sleep apnea     Past Surgical History:  Procedure Laterality Date  . HAND SURGERY Left   . KNEE SURGERY      History reviewed. No pertinent family history. Social History:  reports that he has been smoking Cigarettes.  He uses smokeless tobacco. He reports that he drinks alcohol. He reports that he does not use drugs.  Allergies: No Known Allergies  Medications Prior to Admission  Medication Sig Dispense Refill  . buPROPion (WELLBUTRIN SR) 150 MG 12 hr tablet Take 150 mg by mouth 2 (two) times daily.    Marland Kitchen ibuprofen (ADVIL,MOTRIN) 200 MG tablet Take 800 mg by mouth every 6 (six) hours as needed for headache or moderate pain.    Marland Kitchen omeprazole (PRILOSEC OTC) 20 MG tablet Take 20 mg by mouth daily as needed (acid reflux).    . sildenafil (REVATIO) 20 MG tablet Take 100 mg by mouth daily as needed (erectile dysfunction).   3  . simvastatin (ZOCOR) 40 MG tablet Take 40 mg by mouth at bedtime.  3    No results found for this or any previous visit (from the past 48 hour(s)). No results found.  ROS  Blood pressure (!) 131/95, pulse 77, temperature 98.8 F (37.1 C), temperature source Oral, resp. rate 15, height 6\' 3"  (1.905 m), weight 265 lb (120.2 kg), SpO2 97 %. Physical Exam  Constitutional: He appears well-developed and well-nourished.  HENT:  Mouth/Throat: Oropharynx is clear and moist.  Eyes: Conjunctivae are normal. No scleral icterus.  Neck: No thyromegaly present.  Cardiovascular: Normal rate, regular rhythm and normal heart sounds.   No murmur heard. Respiratory: Effort normal and breath sounds normal.  GI:  Abdomen  is full but soft and nontender without organomegaly or masses.  Musculoskeletal: He exhibits no edema.  Lymphadenopathy:    He has no cervical adenopathy.  Neurological: He is alert.  Skin: Skin is warm and dry.  He has a tattoo over left arm and left ankle.     Assessment/Plan Average risk screening colonoscopy.  Hildred Laser, MD 03/23/2016, 9:49 AM

## 2016-03-23 NOTE — Op Note (Signed)
[facility] Patient Name: Brad Luna Procedure Date: 03/23/2016 9:30 AM MRN: [id number] Date of Birth: [birth year] Attending MD: Hildred Laser , MD CSN: [id number] Age: 50 Admit Type: Outpatient Procedure:                Colonoscopy Indications:              Screening for colorectal malignant neoplasm Providers:                Hildred Laser, MD, Hinton Rao, RN, Charlyne Petrin RN, RN, Randa Spike, Technician Referring MD:             Mitzie Na. Quillian Quince, MD Medicines:                Meperidine 50 mg IV, Midazolam 8 mg IV Complications:            No immediate complications. Estimated Blood Loss:     Estimated blood loss was minimal. Procedure:                Pre-Anesthesia Assessment:                           - Prior to the procedure, a History and Physical                            was performed, and patient medications and                            allergies were reviewed. The patient's tolerance of                            previous anesthesia was also reviewed. The risks                            and benefits of the procedure and the sedation                            options and risks were discussed with the patient.                            All questions were answered, and informed consent                            was obtained. Prior Anticoagulants: The patient                            last took ibuprofen 7 days prior to the procedure.                            ASA Grade Assessment: II - A patient with mild                            systemic disease. After reviewing the risks and  benefits, the patient was deemed in satisfactory                            condition to undergo the procedure.                           After obtaining informed consent, the colonoscope                            was passed under direct vision. Throughout the                            procedure, the patient's blood  pressure, pulse, and                            oxygen saturations were monitored continuously. The                            [id number]) scope was introduced through                            the anus and advanced to the the cecum, identified                            by appendiceal orifice and ileocecal valve. The                            colonoscopy was performed without difficulty. The                            patient tolerated the procedure well. The quality                            of the bowel preparation was good. The ileocecal                            valve, appendiceal orifice, and rectum were                            photographed. Scope In: 9:58:37 AM Scope Out: 10:20:49 AM Scope Withdrawal Time: 0 hours 8 minutes 44 seconds  Total Procedure Duration: 0 hours 22 minutes 12 seconds  Findings:      A 4 mm polyp was found in the ascending colon. The polyp was sessile.       The polyp was removed with a cold snare. Resection and retrieval were       complete.      A few medium-mouthed diverticula were found in the sigmoid colon.      Internal hemorrhoids were found during retroflexion. The hemorrhoids       were small. Impression:               - One 4 mm polyp in the ascending colon, removed  with a cold snare. Resected and retrieved.                           - Diverticulosis in the sigmoid colon.                           - Internal hemorrhoids. Moderate Sedation:      Moderate (conscious) sedation was administered by the endoscopy nurse       and supervised by the endoscopist. The following parameters were       monitored: oxygen saturation, heart rate, blood pressure, CO2       capnography and response to care. Total physician intraservice time was       27 minutes. Recommendation:           - Patient has a contact number available for                            emergencies. The signs and symptoms of potential                             delayed complications were discussed with the                            patient. Return to normal activities tomorrow.                            Written discharge instructions were provided to the                            patient.                           - High fiber diet today.                           - Continue present medications.                           - Await pathology results.                           - Repeat colonoscopy for surveillance based on                            pathology results. Procedure Code(s):        --- Professional ---                           [phone], Colonoscopy, flexible; with removal of                            tumor(s), polyp(s), or other lesion(s) by snare                            technique  [id number], Moderate sedation services provided by the                            same physician or other qualified health care                            professional performing the diagnostic or                            therapeutic service that the sedation supports,                            requiring the presence of an independent trained                            observer to assist in the monitoring of the                            patient's level of consciousness and physiological                            status; initial 15 minutes of intraservice time,                            patient age 66 years or older                           [phone], Moderate sedation services; each additional                            15 minutes intraservice time Diagnosis Code(s):        --- Professional ---                           Z12.11, Encounter for screening for malignant                            neoplasm of colon                           D12.2, Benign neoplasm of ascending colon                           K64.8, Other hemorrhoids                           K57.30, Diverticulosis of large intestine without                             perforation or abscess without bleeding CPT copyright 2016 American Medical Association. All rights reserved. The codes documented in this report are preliminary and upon coder review may  be revised to meet current compliance requirements. Hildred Laser, MD Hildred Laser, MD 03/23/2016 10:27:38 AM This report has been signed electronically. Number of Addenda: 0

## 2016-03-23 NOTE — Discharge Instructions (Signed)
Resume usual medications and high fiber diet. No driving for 24 hours. Physician will call with biopsy results.     Colonoscopy, Adult, Care After This sheet gives you information about how to care for yourself after your procedure. Your doctor may also give you more specific instructions. If you have problems or questions, call your doctor. Follow these instructions at home: General instructions  For the first 24 hours after the procedure:  Do not drive or use machinery.  Do not sign important documents.  Do not drink alcohol.  Do your daily activities more slowly than normal.  Eat foods that are soft and easy to digest.  Rest often.  Take over-the-counter or prescription medicines only as told by your doctor.  It is up to you to get the results of your procedure. Ask your doctor, or the department performing the procedure, when your results will be ready. To help cramping and bloating:  Try walking around.  Put heat on your belly (abdomen) as told by your doctor. Use a heat source that your doctor recommends, such as a moist heat pack or a heating pad.  Put a towel between your skin and the heat source.  Leave the heat on for 20-30 minutes.  Remove the heat if your skin turns bright red. This is especially important if you cannot feel pain, heat, or cold. You can get burned. Eating and drinking  Drink enough fluid to keep your pee (urine) clear or pale yellow.  Return to your normal diet as told by your doctor. Avoid heavy or fried foods that are hard to digest.  Avoid drinking alcohol for as long as told by your doctor. Contact a doctor if:  You have blood in your poop (stool) 2-3 days after the procedure. Get help right away if:  You have more than a small amount of blood in your poop.  You see large clumps of tissue (blood clots) in your poop.  Your belly is swollen.  You feel sick to your stomach (nauseous).  You throw up (vomit).  You have a  fever.  You have belly pain that gets worse, and medicine does not help your pain. This information is not intended to replace advice given to you by your health care provider. Make sure you discuss any questions you have with your health care provider. Document Released: 05/27/2010 Document Revised: 01/17/2016 Document Reviewed: 01/17/2016 Elsevier Interactive Patient Education  2017 [place].   Colon Polyps Introduction Polyps are tissue growths inside the body. Polyps can grow in many places, including the large intestine (colon). A polyp may be a round bump or a mushroom-shaped growth. You could have one polyp or several. Most colon polyps are noncancerous (benign). However, some colon polyps can become cancerous over time. What are the causes? The exact cause of colon polyps is not known. What increases the risk? This condition is more likely to develop in people who:  Have a family history of colon cancer or colon polyps.  Are older than 24 or older than 45 if they are African American.  Have inflammatory bowel disease, such as ulcerative colitis or Crohn disease.  Are overweight.  Smoke cigarettes.  Do not get enough exercise.  Drink too much alcohol.  Eat a diet that is:  High in fat and red meat.  Low in fiber.  Had childhood cancer that was treated with abdominal radiation. What are the signs or symptoms? Most polyps do not cause symptoms. If you have symptoms, they  may include:  Blood coming from your rectum when having a bowel movement.  Blood in your stool.The stool may look dark red or black.  A change in bowel habits, such as constipation or diarrhea. How is this diagnosed? This condition is diagnosed with a colonoscopy. This is a procedure that uses a lighted, flexible scope to look at the inside of your colon. How is this treated? Treatment for this condition involves removing any polyps that are found. Those polyps will then be tested for  cancer. If cancer is found, your health care provider will talk to you about options for colon cancer treatment. Follow these instructions at home: Diet  Eat plenty of fiber, such as fruits, vegetables, and whole grains.  Eat foods that are high in calcium and vitamin D, such as milk, cheese, yogurt, eggs, liver, fish, and broccoli.  Limit foods high in fat, red meats, and processed meats, such as hot dogs, sausage, bacon, and lunch meats.  Maintain a healthy weight, or lose weight if recommended by your health care provider. General instructions  Do not smoke cigarettes.  Do not drink alcohol excessively.  Keep all follow-up visits as told by your health care provider. This is important. This includes keeping regularly scheduled colonoscopies. Talk to your health care provider about when you need a colonoscopy.  Exercise every day or as told by your health care provider. Contact a health care provider if:  You have new or worsening bleeding during a bowel movement.  You have new or increased blood in your stool.  You have a change in bowel habits.  You unexpectedly lose weight. This information is not intended to replace advice given to you by your health care provider. Make sure you discuss any questions you have with your health care provider. Document Released: 01/19/2004 Document Revised: 09/30/2015 Document Reviewed: 03/15/2015  2017 Elsevier    Diverticulosis Diverticulosis is the condition that develops when small pouches (diverticula) form in the wall of your colon. Your colon, or large intestine, is where water is absorbed and stool is formed. The pouches form when the inside layer of your colon pushes through weak spots in the outer layers of your colon. CAUSES  No one knows exactly what causes diverticulosis. RISK FACTORS  Being older than 67. Your risk for this condition increases with age. Diverticulosis is rare in people younger than 40 years. By age 79,  almost everyone has it.  Eating a low-fiber diet.  Being frequently constipated.  Being overweight.  Not getting enough exercise.  Smoking.  Taking over-the-counter pain medicines, like aspirin and ibuprofen. SYMPTOMS  Most people with diverticulosis do not have symptoms. DIAGNOSIS  Because diverticulosis often has no symptoms, health care providers often discover the condition during an exam for other colon problems. In many cases, a health care provider will diagnose diverticulosis while using a flexible scope to examine the colon (colonoscopy). TREATMENT  If you have never developed an infection related to diverticulosis, you may not need treatment. If you have had an infection before, treatment may include:  Eating more fruits, vegetables, and grains.  Taking a fiber supplement.  Taking a live bacteria supplement (probiotic).  Taking medicine to relax your colon. HOME CARE INSTRUCTIONS   Drink at least 6-8 glasses of water each day to prevent constipation.  Try not to strain when you have a bowel movement.  Keep all follow-up appointments. If you have had an infection before:  Increase the fiber in your diet as directed by  your health care provider or dietitian.  Take a dietary fiber supplement if your health care provider approves.  Only take medicines as directed by your health care provider. SEEK MEDICAL CARE IF:   You have abdominal pain.  You have bloating.  You have cramps.  You have not gone to the bathroom in 3 days. SEEK IMMEDIATE MEDICAL CARE IF:   Your pain gets worse.  Yourbloating becomes very bad.  You have a fever or chills, and your symptoms suddenly get worse.  You begin vomiting.  You have bowel movements that are bloody or black. MAKE SURE YOU:  Understand these instructions.  Will watch your condition.  Will get help right away if you are not doing well or get worse. This information is not intended to replace advice given  to you by your health care provider. Make sure you discuss any questions you have with your health care provider. Document Released: 01/20/2004 Document Revised: 04/29/2013 Document Reviewed: 03/19/2013 Elsevier Interactive Patient Education  2017 [place].     High-Fiber Diet Fiber, also called dietary fiber, is a type of carbohydrate found in fruits, vegetables, whole grains, and beans. A high-fiber diet can have many health benefits. Your health care provider may recommend a high-fiber diet to help:  Prevent constipation. Fiber can make your bowel movements more regular.  Lower your cholesterol.  Relieve hemorrhoids, uncomplicated diverticulosis, or irritable bowel syndrome.  Prevent overeating as part of a weight-loss plan.  Prevent heart disease, type 2 diabetes, and certain cancers. What is my plan? The recommended daily intake of fiber includes:  38 grams for men under age 46.  23 grams for men over age 61.  67 grams for women under age 22.  40 grams for women over age 50. You can get the recommended daily intake of dietary fiber by eating a variety of fruits, vegetables, grains, and beans. Your health care provider may also recommend a fiber supplement if it is not possible to get enough fiber through your diet. What do I need to know about a high-fiber diet?  Fiber supplements have not been widely studied for their effectiveness, so it is better to get fiber through food sources.  Always check the fiber content on thenutrition facts label of any prepackaged food. Look for foods that contain at least 5 grams of fiber per serving.  Ask your dietitian if you have questions about specific foods that are related to your condition, especially if those foods are not listed in the following section.  Increase your daily fiber consumption gradually. Increasing your intake of dietary fiber too quickly may cause bloating, cramping, or gas.  Drink plenty of water. Water  helps you to digest fiber. What foods can I eat? Grains  Whole-grain breads. Multigrain cereal. Oats and oatmeal. Brown rice. Barley. Bulgur wheat. [place]. Bran muffins. Popcorn. Rye wafer crackers. Vegetables  Sweet potatoes. Spinach. Kale. Artichokes. Cabbage. Broccoli. Green peas. Carrots. Squash. Fruits  Berries. Pears. Apples. Oranges. Avocados. Prunes and raisins. Dried figs. Meats and Other Protein Sources  [place], kidney, pinto, and soy beans. Split peas. Lentils. Nuts and seeds. Dairy  Fiber-fortified yogurt. Beverages  Fiber-fortified soy milk. Fiber-fortified orange juice. Other  Fiber bars. The items listed above may not be a complete list of recommended foods or beverages. Contact your dietitian for more options.  What foods are not recommended? Grains  White bread. Pasta made with refined flour. White rice. Vegetables  Fried potatoes. Canned vegetables. Well-cooked vegetables. Fruits  Fruit juice.  Cooked, strained fruit. Meats and Other Protein Sources  Fatty cuts of meat. Fried Sales executive or fried fish. Dairy  Milk. Yogurt. Cream cheese. Sour cream. Beverages  Soft drinks. Other  Cakes and pastries. Butter and oils. The items listed above may not be a complete list of foods and beverages to avoid. Contact your dietitian for more information.  What are some tips for including high-fiber foods in my diet?  Eat a wide variety of high-fiber foods.  Make sure that half of all grains consumed each day are whole grains.  Replace breads and cereals made from refined flour or white flour with whole-grain breads and cereals.  Replace white rice with brown rice, bulgur wheat, or millet.  Start the day with a breakfast that is high in fiber, such as a cereal that contains at least 5 grams of fiber per serving.  Use beans in place of meat in soups, salads, or pasta.  Eat high-fiber snacks, such as berries, raw vegetables, nuts, or popcorn. This information is not intended  to replace advice given to you by your health care provider. Make sure you discuss any questions you have with your health care provider. Document Released: 04/24/2005 Document Revised: 09/30/2015 Document Reviewed: 10/07/2013 Elsevier Interactive Patient Education  2017 [employer].

## 2016-03-28 ENCOUNTER — Encounter (HOSPITAL_COMMUNITY): Payer: Self-pay | Admitting: Internal Medicine

## 2016-09-02 ENCOUNTER — Observation Stay (HOSPITAL_BASED_OUTPATIENT_CLINIC_OR_DEPARTMENT_OTHER): Payer: BLUE CROSS/BLUE SHIELD

## 2016-09-02 ENCOUNTER — Emergency Department (HOSPITAL_COMMUNITY): Payer: BLUE CROSS/BLUE SHIELD

## 2016-09-02 ENCOUNTER — Observation Stay (HOSPITAL_COMMUNITY)
Admission: EM | Admit: 2016-09-02 | Discharge: 2016-09-02 | Disposition: A | Discharge status: Home / Self Care | Payer: BLUE CROSS/BLUE SHIELD | Attending: Internal Medicine | Admitting: Internal Medicine

## 2016-09-02 ENCOUNTER — Encounter (HOSPITAL_COMMUNITY): Payer: Self-pay | Admitting: *Deleted

## 2016-09-02 DIAGNOSIS — R55 Syncope and collapse: Principal | ICD-10-CM | POA: Insufficient documentation

## 2016-09-02 DIAGNOSIS — F17210 Nicotine dependence, cigarettes, uncomplicated: Secondary | ICD-10-CM | POA: Insufficient documentation

## 2016-09-02 DIAGNOSIS — E785 Hyperlipidemia, unspecified: Secondary | ICD-10-CM | POA: Insufficient documentation

## 2016-09-02 DIAGNOSIS — G4733 Obstructive sleep apnea (adult) (pediatric): Secondary | ICD-10-CM | POA: Diagnosis not present

## 2016-09-02 DIAGNOSIS — S0181XA Laceration without foreign body of other part of head, initial encounter: Secondary | ICD-10-CM | POA: Diagnosis not present

## 2016-09-02 DIAGNOSIS — Y92003 Bedroom of unspecified non-institutional (private) residence as the place of occurrence of the external cause: Secondary | ICD-10-CM | POA: Insufficient documentation

## 2016-09-02 DIAGNOSIS — W1839XA Other fall on same level, initial encounter: Secondary | ICD-10-CM | POA: Diagnosis not present

## 2016-09-02 DIAGNOSIS — E7800 Pure hypercholesterolemia, unspecified: Secondary | ICD-10-CM | POA: Insufficient documentation

## 2016-09-02 DIAGNOSIS — Z79899 Other long term (current) drug therapy: Secondary | ICD-10-CM | POA: Insufficient documentation

## 2016-09-02 DIAGNOSIS — R110 Nausea: Secondary | ICD-10-CM | POA: Diagnosis not present

## 2016-09-02 DIAGNOSIS — Z23 Encounter for immunization: Secondary | ICD-10-CM | POA: Insufficient documentation

## 2016-09-02 DIAGNOSIS — F320 Major depressive disorder, single episode, mild: Secondary | ICD-10-CM

## 2016-09-02 DIAGNOSIS — F329 Major depressive disorder, single episode, unspecified: Secondary | ICD-10-CM | POA: Diagnosis not present

## 2016-09-02 DIAGNOSIS — I959 Hypotension, unspecified: Secondary | ICD-10-CM | POA: Diagnosis not present

## 2016-09-02 DIAGNOSIS — N289 Disorder of kidney and ureter, unspecified: Secondary | ICD-10-CM | POA: Diagnosis not present

## 2016-09-02 DIAGNOSIS — F32A Depression, unspecified: Secondary | ICD-10-CM | POA: Diagnosis present

## 2016-09-02 LAB — BASIC METABOLIC PANEL
Anion gap: 9 (ref 5–15)
BUN: 16 mg/dL (ref 6–20)
CO2: 23 mmol/L (ref 22–32)
Calcium: 8.5 mg/dL — ABNORMAL LOW (ref 8.9–10.3)
Chloride: 105 mmol/L (ref 101–111)
Creatinine, Ser: 1.15 mg/dL (ref 0.61–1.24)
GFR calc Af Amer: >60 mL/min (ref >60)
GLUCOSE: 179 mg/dL — AB (ref 65–99)
Potassium: 4 mmol/L (ref 3.5–5.1)
Sodium: 137 mmol/L (ref 135–145)

## 2016-09-02 LAB — I-STAT CHEM 8, ED
BUN: 22 mg/dL — ABNORMAL HIGH (ref 6–20)
Calcium, Ion: 0.98 mmol/L — ABNORMAL LOW (ref 1.15–1.40)
Chloride: 101 mmol/L (ref 101–111)
Creatinine, Ser: 1.3 mg/dL — ABNORMAL HIGH (ref 0.61–1.24)
Glucose, Bld: 132 mg/dL — ABNORMAL HIGH (ref 65–99)
HCT: 48 % (ref 39.0–52.0)
HEMOGLOBIN: 16.3 g/dL (ref 13.0–17.0)
POTASSIUM: 3.5 mmol/L (ref 3.5–5.1)
Sodium: 141 mmol/L (ref 135–145)
TCO2: 26 mmol/L (ref 0–100)

## 2016-09-02 LAB — COMPREHENSIVE METABOLIC PANEL
ALK PHOS: 55 U/L (ref 38–126)
ALT: 30 U/L (ref 17–63)
AST: 30 U/L (ref 15–41)
Albumin: 3.6 g/dL (ref 3.5–5.0)
Anion gap: 11 (ref 5–15)
BILIRUBIN TOTAL: 0.4 mg/dL (ref 0.3–1.2)
BUN: 19 mg/dL (ref 6–20)
CO2: 26 mmol/L (ref 22–32)
CREATININE: 1.35 mg/dL — AB (ref 0.61–1.24)
Calcium: 8.6 mg/dL — ABNORMAL LOW (ref 8.9–10.3)
Chloride: 102 mmol/L (ref 101–111)
GFR calc Af Amer: >60 mL/min (ref >60)
GFR, EST NON AFRICAN AMERICAN: 59 mL/min — AB (ref >60)
Glucose, Bld: 133 mg/dL — ABNORMAL HIGH (ref 65–99)
Potassium: 3.5 mmol/L (ref 3.5–5.1)
Sodium: 139 mmol/L (ref 135–145)
TOTAL PROTEIN: 5.9 g/dL — AB (ref 6.5–8.1)

## 2016-09-02 LAB — CBC WITH DIFFERENTIAL/PLATELET
BASOS ABS: 0 10*3/uL (ref 0.0–0.1)
Basophils Relative: 0 %
EOS PCT: 2 %
Eosinophils Absolute: 0.1 10*3/uL (ref 0.0–0.7)
HCT: 48.5 % (ref 39.0–52.0)
Hemoglobin: 16.5 g/dL (ref 13.0–17.0)
LYMPHS ABS: 2.4 10*3/uL (ref 0.7–4.0)
Lymphocytes Relative: 41 %
MCH: 31.2 pg (ref 26.0–34.0)
MCHC: 34 g/dL (ref 30.0–36.0)
MCV: 91.7 fL (ref 78.0–100.0)
MONO ABS: 0.2 10*3/uL (ref 0.1–1.0)
MONOS PCT: 3 %
Neutro Abs: 3.3 10*3/uL (ref 1.7–7.7)
Neutrophils Relative %: 54 %
Platelets: 154 10*3/uL (ref 150–400)
RBC: 5.29 MIL/uL (ref 4.22–5.81)
RDW: 13.7 % (ref 11.5–15.5)
WBC: 6 10*3/uL (ref 4.0–10.5)

## 2016-09-02 LAB — BRAIN NATRIURETIC PEPTIDE: B Natriuretic Peptide: 8.4 pg/mL (ref 0.0–100.0)

## 2016-09-02 LAB — ETHANOL: Alcohol, Ethyl (B): <5 mg/dL (ref <5)

## 2016-09-02 LAB — URINALYSIS, ROUTINE W REFLEX MICROSCOPIC
Bilirubin Urine: NEGATIVE
GLUCOSE, UA: 50 mg/dL — AB
Hgb urine dipstick: NEGATIVE
KETONES UR: NEGATIVE mg/dL
LEUKOCYTES UA: NEGATIVE
Nitrite: NEGATIVE
PROTEIN: NEGATIVE mg/dL
Specific Gravity, Urine: 1.016 (ref 1.005–1.030)
pH: 6 (ref 5.0–8.0)

## 2016-09-02 LAB — ECHOCARDIOGRAM COMPLETE
HEIGHTINCHES: 75 in
WEIGHTICAEL: 4430.36 [oz_av]

## 2016-09-02 LAB — LACTIC ACID, PLASMA: Lactic Acid, Venous: 1.7 mmol/L (ref 0.5–1.9)

## 2016-09-02 LAB — I-STAT TROPONIN, ED: Troponin i, poc: 0 ng/mL (ref 0.00–0.08)

## 2016-09-02 LAB — PROTIME-INR
INR: 1.06
PROTHROMBIN TIME: 13.9 s (ref 11.4–15.2)

## 2016-09-02 LAB — MAGNESIUM: Magnesium: 1.9 mg/dL (ref 1.7–2.4)

## 2016-09-02 LAB — I-STAT CG4 LACTIC ACID, ED: LACTIC ACID, VENOUS: 2.53 mmol/L — AB (ref 0.5–1.9)

## 2016-09-02 LAB — GLUCOSE, CAPILLARY: Glucose-Capillary: 155 mg/dL — ABNORMAL HIGH (ref 65–99)

## 2016-09-02 MED ORDER — BUPROPION HCL ER (SR) 150 MG PO TB12: 150.0000 mg | ORAL_TABLET | Freq: Every evening | ORAL | Status: DC

## 2016-09-02 MED ORDER — HYDROCODONE-ACETAMINOPHEN 5-325 MG PO TABS: 1.0000 | ORAL_TABLET | ORAL | Status: DC | PRN

## 2016-09-02 MED ORDER — LIDOCAINE-EPINEPHRINE 1 %-1:100000 IJ SOLN
10.0000 mL | Freq: Once | INTRAMUSCULAR | Status: AC
  Administered 2016-09-02: 10 mL via INTRADERMAL
  Filled 2016-09-02: qty 10

## 2016-09-02 MED ORDER — ONDANSETRON HCL 4 MG/2ML IJ SOLN: 4.0000 mg | Freq: Four times a day (QID) | INTRAMUSCULAR | Status: DC | PRN

## 2016-09-02 MED ORDER — ACETAMINOPHEN 325 MG PO TABS: 650.0000 mg | ORAL_TABLET | Freq: Four times a day (QID) | ORAL | Status: DC | PRN

## 2016-09-02 MED ORDER — ENOXAPARIN SODIUM 40 MG/0.4ML ~~LOC~~ SOLN
40.0000 mg | SUBCUTANEOUS | Status: DC
  Administered 2016-09-02: 40 mg via SUBCUTANEOUS
  Filled 2016-09-02: qty 0.4

## 2016-09-02 MED ORDER — SODIUM CHLORIDE 0.9% FLUSH
3.0000 mL | Freq: Two times a day (BID) | INTRAVENOUS | Status: DC
  Administered 2016-09-02: 3 mL via INTRAVENOUS

## 2016-09-02 MED ORDER — PERFLUTREN LIPID MICROSPHERE
1.0000 mL | INTRAVENOUS | Status: AC | PRN
  Administered 2016-09-02: 3 mL via INTRAVENOUS
  Filled 2016-09-02: qty 10

## 2016-09-02 MED ORDER — TETANUS-DIPHTH-ACELL PERTUSSIS 5-2.5-18.5 LF-MCG/0.5 IM SUSP
0.5000 mL | Freq: Once | INTRAMUSCULAR | Status: AC
  Administered 2016-09-02: 0.5 mL via INTRAMUSCULAR
  Filled 2016-09-02: qty 0.5

## 2016-09-02 MED ORDER — PANTOPRAZOLE SODIUM 40 MG PO TBEC: 40.0000 mg | DELAYED_RELEASE_TABLET | Freq: Every day | ORAL | Status: DC | PRN

## 2016-09-02 MED ORDER — IOPAMIDOL (ISOVUE-300) INJECTION 61%
INTRAVENOUS | Status: AC
  Administered 2016-09-02: 100 mL
  Filled 2016-09-02: qty 100

## 2016-09-02 MED ORDER — ACETAMINOPHEN 650 MG RE SUPP: 650.0000 mg | Freq: Four times a day (QID) | RECTAL | Status: DC | PRN

## 2016-09-02 MED ORDER — SIMVASTATIN 40 MG PO TABS: 40.0000 mg | ORAL_TABLET | Freq: Every day | ORAL | Status: DC

## 2016-09-02 MED ORDER — SODIUM CHLORIDE 0.9 % IV BOLUS (SEPSIS)
1000.0000 mL | Freq: Once | INTRAVENOUS | Status: AC
  Administered 2016-09-02: 1000 mL via INTRAVENOUS

## 2016-09-02 MED ORDER — ONDANSETRON HCL 4 MG PO TABS: 4.0000 mg | ORAL_TABLET | Freq: Four times a day (QID) | ORAL | Status: DC | PRN

## 2016-09-02 MED ORDER — SODIUM CHLORIDE 0.9 % IV SOLN
INTRAVENOUS | Status: AC
  Administered 2016-09-02: 04:00:00 via INTRAVENOUS

## 2016-09-02 NOTE — Progress Notes (Addendum)
Patient seen and examined    51 y.o. male with medical history significant for hyperlipidemia and OSA who presents the emergency department after syncopal episode at home. He was laying in bed and had sudden onset of SOB.  He then stood up because his breathing was so short.  He instantly fell face first to the ground. Patient states he also had severe substernal CP without radiation.  He did not wake up for several minutes.  This occurred one time in the past 3 years ago and he was dx with vasovagal syncope CTA PE study was performed and negative for PE, and also negative for any acute cardiopulmonary disease. Patient was given another liter of normal saline in the ED, had his Tdap updated, and had a small forehead laceration closed by the ED physician  Plan Continue telemetry, cardiology consult for ILR , if syncope recurs 2-D echo pending Check orthostatics, suspect dehydration in the setting of increased lactic acid Check UA

## 2016-09-02 NOTE — ED Provider Notes (Signed)
[facility] DEPT Provider Note   CSN: [id number] Arrival date & time: 09/02/16  0000     History   Chief Complaint Chief Complaint  Patient presents with  . Chest Pain    HPI Brad Luna is a 51 y.o. male PMH of HLD, OSA, presenting today with syncopal episode witnessed by wife.  He was laying in bed and had sudden onset of SOB.  He then stood up because his breathing was so short.  He instantly fell face first to the ground. Patient states he also had severe substernal CP without radiation.  He did not wake up for several minutes.  This occurred one time in the past 3 years ago and he was dx with vasovagal syncope.  EMS states he was hypotensive and hypoxic on arrival. He reciceved 1L IVF bolus and O2.  He is currently back to his normal baseline.   10 Systems reviewed and are negative for acute change except as noted in the HPI.   HPI  Past Medical History:  Diagnosis Date  . High cholesterol   . Hypercholesteremia   . Sleep apnea     Patient Active Problem List   Diagnosis Date Noted  . Special screening for malignant neoplasms, colon 12/16/2015    Past Surgical History:  Procedure Laterality Date  . COLONOSCOPY N/A 03/23/2016   Procedure: COLONOSCOPY;  Surgeon: Rogene Houston, MD;  Location: [facility];  Service: Endoscopy;  Laterality: N/A;  1030 - moved to 11/16 @ 10:30 - Ann notified pt  . HAND SURGERY Left   . KNEE SURGERY         Home Medications    Prior to Admission medications   Medication Sig Start Date End Date Taking? Authorizing Provider  buPROPion (WELLBUTRIN SR) 150 MG 12 hr tablet Take 150 mg by mouth 2 (two) times daily. 10/08/13   Historical Provider, MD  ibuprofen (ADVIL,MOTRIN) 200 MG tablet Take 800 mg by mouth every 6 (six) hours as needed for headache or moderate pain.    Historical Provider, MD  omeprazole (PRILOSEC OTC) 20 MG tablet Take 20 mg by mouth daily as needed (acid reflux).    Historical Provider, MD  sildenafil  (REVATIO) 20 MG tablet Take 100 mg by mouth daily as needed (erectile dysfunction).  01/14/16   Historical Provider, MD  simvastatin (ZOCOR) 40 MG tablet Take 40 mg by mouth at bedtime. 01/21/16   Historical Provider, MD    Family History No family history on file.  Social History Social History  Substance Use Topics  . Smoking status: Current Every Day Smoker    Types: Cigarettes  . Smokeless tobacco: Current User  . Alcohol use Yes     Comment: Occ     Allergies   Patient has no known allergies.   Review of Systems Review of Systems   Physical Exam Updated Vital Signs BP 125/88   Pulse 86   Resp 14   Ht 6\' 3"  (1.905 m)   Wt 270 lb (122.5 kg)   SpO2 96%   BMI 33.75 kg/m   Physical Exam  Constitutional: He is oriented to person, place, and time. Vital signs are normal. He appears well-developed and well-nourished.  Non-toxic appearance. He does not appear ill. No distress.  HENT:  Head: Normocephalic.  Nose: Nose normal.  Mouth/Throat: Oropharynx is clear and moist. No oropharyngeal exudate.  0.5cm laceration to the forehead NRB in place  Eyes: Conjunctivae and EOM are normal. Pupils are equal, round,  and reactive to light. No scleral icterus.  Neck: Normal range of motion. Neck supple. No tracheal deviation, no edema, no erythema and normal range of motion present. No thyroid mass and no thyromegaly present.  Cardiovascular: Normal rate, regular rhythm, S1 normal, S2 normal, normal heart sounds, intact distal pulses and normal pulses.  Exam reveals no gallop and no friction rub.   No murmur heard. Pulmonary/Chest: Effort normal and breath sounds normal. No respiratory distress. He has no wheezes. He has no rhonchi. He has no rales.  Abdominal: Soft. Normal appearance and bowel sounds are normal. He exhibits no distension, no ascites and no mass. There is no hepatosplenomegaly. There is no tenderness. There is no rebound, no guarding and no CVA tenderness.    Musculoskeletal: Normal range of motion. He exhibits no edema or tenderness.  Lymphadenopathy:    He has no cervical adenopathy.  Neurological: He is alert and oriented to person, place, and time. He has normal strength. No cranial nerve deficit or sensory deficit.  Skin: Skin is warm, dry and intact. No petechiae and no rash noted. He is not diaphoretic. No erythema. No pallor.  Nursing note and vitals reviewed.    ED Treatments / Results  Labs (all labs ordered are listed, but only abnormal results are displayed) Labs Reviewed  COMPREHENSIVE METABOLIC PANEL - Abnormal; Notable for the following:       Result Value   Glucose, Bld 133 (*)    Creatinine, Ser 1.35 (*)    Calcium 8.6 (*)    Total Protein 5.9 (*)    GFR calc non Af Amer 59 (*)    All other components within normal limits  I-STAT CHEM 8, ED - Abnormal; Notable for the following:    BUN 22 (*)    Creatinine, Ser 1.30 (*)    Glucose, Bld 132 (*)    Calcium, Ion 0.98 (*)    All other components within normal limits  I-STAT CG4 LACTIC ACID, ED - Abnormal; Notable for the following:    Lactic Acid, Venous 2.53 (*)    All other components within normal limits  CBC WITH DIFFERENTIAL/PLATELET  BRAIN NATRIURETIC PEPTIDE  PROTIME-INR  MAGNESIUM  ETHANOL  RAPID URINE DRUG SCREEN, HOSP PERFORMED  I-STAT TROPOININ, ED    EKG  EKG Interpretation  Date/Time:  Saturday September 02 2016 00:05:09 EDT Ventricular Rate:  85 PR Interval:    QRS Duration: 100 QT Interval:  401 QTC Calculation: 477 R Axis:   -17 Text Interpretation:  Sinus rhythm Borderline left axis deviation Low voltage, precordial leads Abnormal R-wave progression, early transition Baseline wander in lead(s) V2 No significant change since last tracing Confirmed by Glynn Octave [phone]) on 09/02/2016 12:09:01 AM       Radiology Ct Head Wo Contrast  Result Date: 09/02/2016 CLINICAL DATA:  51 year old male with syncope. EXAM: CT HEAD WITHOUT  CONTRAST TECHNIQUE: Contiguous axial images were obtained from the base of the skull through the vertex without intravenous contrast. COMPARISON:  Head CT dated 11/21/2013 FINDINGS: Brain: No evidence of acute infarction, hemorrhage, hydrocephalus, extra-axial collection or mass lesion/mass effect. Vascular: No hyperdense vessel or unexpected calcification. Skull: Normal. Negative for fracture or focal lesion. Sinuses/Orbits: The visualized paranasal sinuses and mastoid air cells are clear. There is a small right maxillary sinus retention cyst or polyp. Other: None IMPRESSION: No acute intracranial pathology.  Normal unenhanced CT of the brain. Electronically Signed   By: Anner Crete M.D.   On: 09/02/2016 01:03  Ct Angio Chest Pe W Or Wo Contrast  Result Date: 09/02/2016 CLINICAL DATA:  Shortness of breath and chest pain, fell on floor. Hypotensive and hypoxic. EXAM: CT ANGIOGRAPHY CHEST WITH CONTRAST TECHNIQUE: Multidetector CT imaging of the chest was performed using the standard protocol during bolus administration of intravenous contrast. Multiplanar CT image reconstructions and MIPs were obtained to evaluate the vascular anatomy. CONTRAST:  149mL ISOVUE-300 IOPAMIDOL (ISOVUE-300) INJECTION 61% COMPARISON:  Chest CT November 21, 2013 FINDINGS: CARDIOVASCULAR: Suboptimal contrast opacification of the pulmonary artery's. Main pulmonary artery is not enlarged. No pulmonary arterial filling defects to the level of the subsegmental branches. Heart size is normal, no right heart strain. Minimal coronary artery calcifications. No pericardial effusion. Thoracic aorta is normal course and caliber, trace calcific atherosclerosis. MEDIASTINUM/NODES: No lymphadenopathy by CT size criteria.Mildly patulous esophagus. LUNGS/PLEURA: Tracheobronchial tree is patent, no pneumothorax. No pleural effusions, focal consolidations, pulmonary masses. Stable 3 mm sub solid subpleural RIGHT lower lobe pulmonary nodule, no indicated  follow-up. Dependent atelectasis. UPPER ABDOMEN: Included view of the abdomen is nonacute. 3.4 cm exophytic similar benign-appearing RIGHT renal cyst. MUSCULOSKELETAL: Visualized soft tissues and included osseous structures are nonacute. Mild degenerative change of the thoracic spine. Severe C6-7 degenerative disc, incompletely assessed. Review of the MIP images confirms the above findings. IMPRESSION: No pulmonary embolism nor acute cardiopulmonary process. Electronically Signed   By: Elon Alas M.D.   On: 09/02/2016 01:17    Procedures Procedures (including critical care time)  Medications Ordered in ED Medications  Tdap (BOOSTRIX) injection 0.5 mL (0.5 mLs Intramuscular Given 09/02/16 0055)  sodium chloride 0.9 % bolus 1,000 mL (1,000 mLs Intravenous New Bag/Given 09/02/16 0016)  iopamidol (ISOVUE-300) 61 % injection (100 mLs  Contrast Given 09/02/16 0030)  lidocaine-EPINEPHrine (XYLOCAINE W/EPI) 1 %-1:100000 (with pres) injection 10 mL (10 mLs Intradermal Given 09/02/16 0102)     Initial Impression / Assessment and Plan / ED Course  I have reviewed the triage vital signs and the nursing notes.  Pertinent labs & imaging results that were available during my care of the patient were reviewed by me and considered in my medical decision making (see chart for details).     Patient presents to the ED for syncope.  EMS EKG does not show any STEMI.  Repeat here s unchanged.  Will obtain CT head for trauma and CTA for possible PE.  Labs are pending. Patient will require admission for further evaluation.  Labs are CT are unremarkable.  Will admit for syncopal work up    [place] by: Everlene Balls Authorized by: Everlene Balls Consent: Verbal consent obtained. Risks and benefits: risks, benefits and alternatives were discussed Consent given by: patient Patient identity confirmed: provided demographic data Prepped and Draped in normal sterile fashion Wound  explored  Laceration Location: forehead  Laceration Length: 0.5cm  No Foreign Bodies seen or palpated  Anesthesia: local infiltration  Local anesthetic: lidocaine 1% with epinephrine  Anesthetic total: 2 ml  Irrigation method: syringe Amount of cleaning: standard  Skin closure: 4-0 ethilon sutures  Number of sutures: 2  Technique: simple interrupted  Patient tolerance: Patient tolerated the procedure well with no immediate complications.    Final Clinical Impressions(s) / ED Diagnoses   Final diagnoses:  None    New Prescriptions New Prescriptions   No medications on file     Everlene Balls, MD 09/02/16 1165

## 2016-09-02 NOTE — ED Notes (Signed)
Wife at the bedside.  The pt jumped from the bed and onto the floor  His wife noticed a lt facial droop with drooling and he was unable to  Use his lt arm  Alert oriented skin warm and dry

## 2016-09-02 NOTE — ED Notes (Signed)
To ct

## 2016-09-02 NOTE — ED Notes (Signed)
Pt returned from c-t  No chest pain

## 2016-09-02 NOTE — Progress Notes (Signed)
Echocardiogram 2D Echocardiogram with contrast has been performed.  Brad Luna 09/02/2016, 12:31 PM

## 2016-09-02 NOTE — ED Notes (Signed)
MD Oni notified of I-stat CG4 resulting to be 2.53

## 2016-09-02 NOTE — Progress Notes (Signed)
Discharge instructions printed and reviewed with patient and family, and copy given for them to take home. All questions addressed at this time. IV's and tele box removed. Room searched for patient belongings, and confirmed with patient that all valuables were accounted for. Staff escorted patient to discharge via wheelchair in no apparent distress.

## 2016-09-02 NOTE — ED Triage Notes (Signed)
The pt arrived by    [facility] from home he woke up from soound sleep and fell in the floor .  c/o sob and chest pain non rebreather on arrival no chest pain   Low bp when ems arrived  Iv x 2  18s in both  A-cs.  1000 cc nss infused in the ambulance.  Baby aspirin x 4 given by ems

## 2016-09-02 NOTE — H&P (Signed)
History and Physical    KAUAN KLOOSTERMAN KXF:[id number] DOB: 10-08-1965 DOA: 09/02/2016  PCP: Gar Ponto, MD   Patient coming from: Home  Chief Complaint: Syncope   HPI: Brad Luna is a 51 y.o. male with medical history significant for hyperlipidemia and OSA who presents the emergency department after syncopal episode at home. Patient reports that he had an uneventful day yesterday and was in his usual state when he went to bed, but woke with acute shortness of breath, attempted to get up from bed, then developed acute epigastric discomfort with nausea and severe lightheadedness, and was then witnessed by his wife to stumble a couple steps before falling forward onto the floor. He was reportedly unresponsive for a couple minutes and EMS was called out. He was reportedly hypotensive upon their arrival with SBP in the 70s and they also report that he was hypoxic and started on supplemental oxygen. He was given 1 L of normal saline en route to the hospital and reports feeling back to his baseline by time of arrival. Patient denies any headache, change in vision or hearing, or focal numbness or weakness. He denies chest pain or palpitations. There has not been any lower extremity swelling or tenderness and no recent prolonged immobilization. He describes a very similar episode approximately 3 years ago and reports being diagnosed with vasovagal syncope at that time. There has not been any new medications recently, he has maintained a good appetite, and denies any recent diarrhea or vomiting.  ED Course: Upon arrival to the ED, patient is found to be saturating well on room air, and with vitals normal and stable. EKG features a sinus rhythm with low voltage QRS and early transition. Noncontrast head CT is negative for acute intracranial abnormality. Chemistry panel reveals a creatinine of 1.35, up from 1.0 and 2015. CBC is unremarkable. Ethanol level was undetectable. Lactic acid is mildly elevated to  2.53. Troponin is undetectable and BNP is within the normal limits. CTA PE study was performed and negative for PE, and also negative for any acute cardiopulmonary disease. Patient was given another liter of normal saline in the ED, had his Tdap updated, and had a small forehead laceration closed by the ED physician. He has remained hemodynamically stable in the ED and in no apparent respiratory distress. He will be observed on the telemetry unit for ongoing evaluation and management of a syncopal episode.  Review of Systems:  All other systems reviewed and apart from HPI, are negative.  Past Medical History:  Diagnosis Date  . High cholesterol   . Hypercholesteremia   . Sleep apnea     Past Surgical History:  Procedure Laterality Date  . COLONOSCOPY N/A 03/23/2016   Procedure: COLONOSCOPY;  Surgeon: Rogene Houston, MD;  Location: [facility];  Service: Endoscopy;  Laterality: N/A;  1030 - moved to 11/16 @ 10:30 - Ann notified pt  . HAND SURGERY Left   . KNEE SURGERY       reports that he has been smoking Cigarettes.  He uses smokeless tobacco. He reports that he drinks alcohol. He reports that he does not use drugs.  No Known Allergies  History reviewed. No pertinent family history.   Prior to Admission medications   Medication Sig Start Date End Date Taking? Authorizing Provider  buPROPion (WELLBUTRIN SR) 150 MG 12 hr tablet Take 150 mg by mouth every evening.  10/08/13  Yes Historical Provider, MD  ibuprofen (ADVIL,MOTRIN) 200 MG tablet Take 800 mg by  mouth every 6 (six) hours as needed for headache or moderate pain.   Yes Historical Provider, MD  omeprazole (PRILOSEC OTC) 20 MG tablet Take 20 mg by mouth daily as needed (acid reflux).   Yes Historical Provider, MD  sildenafil (REVATIO) 20 MG tablet Take 100 mg by mouth daily as needed (erectile dysfunction).  01/14/16  Yes Historical Provider, MD  simvastatin (ZOCOR) 40 MG tablet Take 40 mg by mouth at bedtime. 01/21/16  Yes  Historical Provider, MD    Physical Exam: Vitals:   09/02/16 0008 09/02/16 0015 09/02/16 0030 09/02/16 0100  BP:  (!) 132/99 (!) 110/99 125/88  Pulse:  87 88 86  Resp:  18 18 14   SpO2:  98% 98% 96%  Weight: 122.5 kg (270 lb)     Height: 6\' 3"  (1.905 m)         Constitutional: NAD, calm, comfortable, small lacerations to right forehead and bridge of nose. Eyes: PERTLA, lids and conjunctivae normal ENMT: Mucous membranes are moist. Posterior pharynx clear of any exudate or lesions.   Neck: normal, supple, no masses, no thyromegaly Respiratory: clear to auscultation bilaterally, no wheezing, no crackles. Normal respiratory effort.   Cardiovascular: S1 & S2 heard, regular rate and rhythm. No extremity edema. No significant JVD. Abdomen: No distension, no tenderness, no masses palpated. Bowel sounds normal.  Musculoskeletal: no clubbing / cyanosis. No joint deformity upper and lower extremities.   Skin: no significant rashes, lesions, ulcers. Warm, dry, well-perfused. Neurologic: CN 2-12 grossly intact. Sensation intact, DTR normal. Strength 5/5 in all 4 limbs.  Psychiatric: Alert and oriented x 3. Normal mood and affect.     Labs on Admission: I have personally reviewed following labs and imaging studies  CBC:  Recent Labs Lab 09/02/16 0003 09/02/16 0024  WBC 6.0  --   NEUTROABS 3.3  --   HGB 16.5 16.3  HCT 48.5 48.0  MCV 91.7  --   PLT 154  --    Basic Metabolic Panel:  Recent Labs Lab 09/02/16 0003 09/02/16 0024  NA 139 141  K 3.5 3.5  CL 102 101  CO2 26  --   GLUCOSE 133* 132*  BUN 19 22*  CREATININE 1.35* 1.30*  CALCIUM 8.6*  --   MG 1.9  --    GFR: Estimated Creatinine Clearance: 93.7 mL/min (A) (by C-G formula based on SCr of 1.3 mg/dL (H)). Liver Function Tests:  Recent Labs Lab 09/02/16 0003  AST 30  ALT 30  ALKPHOS 55  BILITOT 0.4  PROT 5.9*  ALBUMIN 3.6   No results for input(s): LIPASE, AMYLASE in the last 168 hours. No results for  input(s): AMMONIA in the last 168 hours. Coagulation Profile:  Recent Labs Lab 09/02/16 0003  INR 1.06   Cardiac Enzymes: No results for input(s): CKTOTAL, CKMB, CKMBINDEX, TROPONINI in the last 168 hours. BNP (last 3 results) No results for input(s): PROBNP in the last 8760 hours. HbA1C: No results for input(s): HGBA1C in the last 72 hours. CBG: No results for input(s): GLUCAP in the last 168 hours. Lipid Profile: No results for input(s): CHOL, HDL, LDLCALC, TRIG, CHOLHDL, LDLDIRECT in the last 72 hours. Thyroid Function Tests: No results for input(s): TSH, T4TOTAL, FREET4, T3FREE, THYROIDAB in the last 72 hours. Anemia Panel: No results for input(s): VITAMINB12, FOLATE, FERRITIN, TIBC, IRON, RETICCTPCT in the last 72 hours. Urine analysis:    Component Value Date/Time   COLORURINE YELLOW 11/21/2013 1930   APPEARANCEUR CLEAR 11/21/2013 1930  LABSPEC 1.010 11/21/2013 1930   PHURINE 7.0 11/21/2013 1930   GLUCOSEU NEGATIVE 11/21/2013 1930   HGBUR NEGATIVE 11/21/2013 1930   BILIRUBINUR NEGATIVE 11/21/2013 1930   KETONESUR NEGATIVE 11/21/2013 1930   PROTEINUR NEGATIVE 11/21/2013 1930   UROBILINOGEN 0.2 11/21/2013 1930   NITRITE NEGATIVE 11/21/2013 1930   LEUKOCYTESUR NEGATIVE 11/21/2013 1930   Sepsis Labs: @LABRCNTIP (procalcitonin:4,lacticidven:4) )No results found for this or any previous visit (from the past 240 hour(s)).   Radiological Exams on Admission: Ct Head Wo Contrast  Result Date: 09/02/2016 CLINICAL DATA:  51 year old male with syncope. EXAM: CT HEAD WITHOUT CONTRAST TECHNIQUE: Contiguous axial images were obtained from the base of the skull through the vertex without intravenous contrast. COMPARISON:  Head CT dated 11/21/2013 FINDINGS: Brain: No evidence of acute infarction, hemorrhage, hydrocephalus, extra-axial collection or mass lesion/mass effect. Vascular: No hyperdense vessel or unexpected calcification. Skull: Normal. Negative for fracture or focal  lesion. Sinuses/Orbits: The visualized paranasal sinuses and mastoid air cells are clear. There is a small right maxillary sinus retention cyst or polyp. Other: None IMPRESSION: No acute intracranial pathology.  Normal unenhanced CT of the brain. Electronically Signed   By: Anner Crete M.D.   On: 09/02/2016 01:03   Ct Angio Chest Pe W Or Wo Contrast  Result Date: 09/02/2016 CLINICAL DATA:  Shortness of breath and chest pain, fell on floor. Hypotensive and hypoxic. EXAM: CT ANGIOGRAPHY CHEST WITH CONTRAST TECHNIQUE: Multidetector CT imaging of the chest was performed using the standard protocol during bolus administration of intravenous contrast. Multiplanar CT image reconstructions and MIPs were obtained to evaluate the vascular anatomy. CONTRAST:  129mL ISOVUE-300 IOPAMIDOL (ISOVUE-300) INJECTION 61% COMPARISON:  Chest CT November 21, 2013 FINDINGS: CARDIOVASCULAR: Suboptimal contrast opacification of the pulmonary artery's. Main pulmonary artery is not enlarged. No pulmonary arterial filling defects to the level of the subsegmental branches. Heart size is normal, no right heart strain. Minimal coronary artery calcifications. No pericardial effusion. Thoracic aorta is normal course and caliber, trace calcific atherosclerosis. MEDIASTINUM/NODES: No lymphadenopathy by CT size criteria.Mildly patulous esophagus. LUNGS/PLEURA: Tracheobronchial tree is patent, no pneumothorax. No pleural effusions, focal consolidations, pulmonary masses. Stable 3 mm sub solid subpleural RIGHT lower lobe pulmonary nodule, no indicated follow-up. Dependent atelectasis. UPPER ABDOMEN: Included view of the abdomen is nonacute. 3.4 cm exophytic similar benign-appearing RIGHT renal cyst. MUSCULOSKELETAL: Visualized soft tissues and included osseous structures are nonacute. Mild degenerative change of the thoracic spine. Severe C6-7 degenerative disc, incompletely assessed. Review of the MIP images confirms the above findings. IMPRESSION:  No pulmonary embolism nor acute cardiopulmonary process. Electronically Signed   By: Elon Alas M.D.   On: 09/02/2016 01:17    EKG: Independently reviewed. Sinus rhythm, low-voltage QRS, early R-transition.   Assessment/Plan  1. Syncope - Pt presents following syncopal episode upon standing; was preceded by acute SOB with epigastric pain and nausea; followed by hypotension per EMS report  - Head CT is a normal study; CTA chest negative for PE or any other cardiopulmonary disease  - EKG with sinus rhythm, no arrhythmia on cardiac monitoring while in ED - He was given a 1 liter NS bolus en route to ED and has good BP on arrival  - Second liter of NS given in ED  - PE ruled-out with CTA chest - Has hx of similar a few yrs ago and was given dx of vasovagal syncope  - The preceding epigastric discomfort and nausea suggests a neurally-mediated (vasovagal) syncope  - The occurrence upon standing suggests orthostasis; orthostatic  vitals unlikely to be helpful as he has already been bolused with 2 liters of NS  - Plan to observe on telemetry and obtain echocardiogram   2. Hypotension  - Reportedly had SBP in 70's on EMS arrival and was given a liter of NS en route to ED  - Resolved by time of arrival in ED - No chest pain, troponin elevation, or EKG finding to suggest ischemia; PE ruled-out with CTA  - Continue telemetry monitoring, follow-up echocardiogram    3. Mild renal insufficiency - SCr is 1.35 on admission, up from 1.0 in 2015 with no more recent values available - Possibly an acute component given the reported hypotension PTA  - He was given 1 liter NS en route and a 2nd liter NS in ED  - Continued on gentle IVF hydration  - Repeat chem panel in am   4. Elevated lactic acid  - Lactic acid elevated to 2.53 initially  - No fever or leukocytosis - Likely secondary to dehydration and hypotension  - He has been fluid-resuscitated, will have another lactate sent    5.  Hyperlipidemia  - Continue Zocor   DVT prophylaxis: sq Lovenox Code Status: Full  Family Communication: Wife updated at bedside Disposition Plan: Observe on telemetry Consults called: None Admission status: Observation   Vianne Bulls, MD [facility] Pager [phone]  If 7PM-7AM, please contact night-coverage [email or web address] Password [email or web address]  09/02/2016, 2:45 AM

## 2016-09-03 NOTE — Discharge Summary (Addendum)
Physician Discharge Summary  Brad Luna MRN: [id number] DOB/AGE: 11/30/1965 51 y.o.  PCP: Gar Ponto, MD   Admit date: 09/02/2016 Discharge date: 09/03/2016  Discharge Diagnoses:  Principal Problem:   Syncope Active Problems:   Depression   Hypotension   OSA (obstructive sleep apnea)   Mild renal insufficiency   Hyperlipidemia    Follow-up recommendations Follow-up with PCP in 3-5 days , including all  additional recommended appointments as below Follow-up CBC, CMP in 3-5 days Patient with need Holter/loop recorder for recurrent syncope in the future Patient did not want to stay in the hospital for 24-hour observation and insisted on being discharged as soon as possible, despite recommendations to stay and be monitored for 24 hours     Discharge Medication List as of 09/02/2016  4:28 PM    CONTINUE these medications which have NOT CHANGED   Details  buPROPion (WELLBUTRIN SR) 150 MG 12 hr tablet Take 150 mg by mouth every evening. , Starting Wed 10/08/2013, Historical Med    ibuprofen (ADVIL,MOTRIN) 200 MG tablet Take 800 mg by mouth every 6 (six) hours as needed for headache or moderate pain., Historical Med    omeprazole (PRILOSEC OTC) 20 MG tablet Take 20 mg by mouth daily as needed (acid reflux)., Historical Med    sildenafil (REVATIO) 20 MG tablet Take 100 mg by mouth daily as needed (erectile dysfunction). , Starting Fri 01/14/2016, Historical Med    simvastatin (ZOCOR) 40 MG tablet Take 40 mg by mouth at bedtime., Starting Fri 01/21/2016, Historical Med         Discharge Condition: Stable Discharge Instructions Get Medicines reviewed and adjusted: Please take all your medications with you for your next visit with your Primary MD  Please request your Primary MD to go over all hospital tests and procedure/radiological results at the follow up, please ask your Primary MD to get all Hospital records sent to his/her office.  If you experience worsening of your  admission symptoms, develop shortness of breath, life threatening emergency, suicidal or homicidal thoughts you must seek medical attention immediately by calling 911 or calling your MD immediately if symptoms less severe.  You must read complete instructions/literature along with all the possible adverse reactions/side effects for all the Medicines you take and that have been prescribed to you. Take any new Medicines after you have completely understood and accpet all the possible adverse reactions/side effects.   Do not drive when taking Pain medications.   Do not take more than prescribed Pain, Sleep and Anxiety Medications  Special Instructions: If you have smoked or chewed Tobacco in the last 2 yrs please stop smoking, stop any regular Alcohol and or any Recreational drug use.  Wear Seat belts while driving.  Please note  You were cared for by a hospitalist during your hospital stay. Once you are discharged, your primary care physician will handle any further medical issues. Please note that NO REFILLS for any discharge medications will be authorized once you are discharged, as it is imperative that you return to your primary care physician (or establish a relationship with a primary care physician if you do not have one) for your aftercare needs so that they can reassess your need for medications and monitor your lab values.  Discharge Instructions    Diet - low sodium heart healthy    Complete by:  As directed    Increase activity slowly    Complete by:  As directed  No Known Allergies    Disposition: 01-Home or Self Care   Consults: None    Significant Diagnostic Studies:  Ct Head Wo Contrast  Result Date: 09/02/2016 CLINICAL DATA:  51 year old male with syncope. EXAM: CT HEAD WITHOUT CONTRAST TECHNIQUE: Contiguous axial images were obtained from the base of the skull through the vertex without intravenous contrast. COMPARISON:  Head CT dated 11/21/2013  FINDINGS: Brain: No evidence of acute infarction, hemorrhage, hydrocephalus, extra-axial collection or mass lesion/mass effect. Vascular: No hyperdense vessel or unexpected calcification. Skull: Normal. Negative for fracture or focal lesion. Sinuses/Orbits: The visualized paranasal sinuses and mastoid air cells are clear. There is a small right maxillary sinus retention cyst or polyp. Other: None IMPRESSION: No acute intracranial pathology.  Normal unenhanced CT of the brain. Electronically Signed   By: Anner Crete M.D.   On: 09/02/2016 01:03   Ct Angio Chest Pe W Or Wo Contrast  Result Date: 09/02/2016 CLINICAL DATA:  Shortness of breath and chest pain, fell on floor. Hypotensive and hypoxic. EXAM: CT ANGIOGRAPHY CHEST WITH CONTRAST TECHNIQUE: Multidetector CT imaging of the chest was performed using the standard protocol during bolus administration of intravenous contrast. Multiplanar CT image reconstructions and MIPs were obtained to evaluate the vascular anatomy. CONTRAST:  198m ISOVUE-300 IOPAMIDOL (ISOVUE-300) INJECTION 61% COMPARISON:  Chest CT November 21, 2013 FINDINGS: CARDIOVASCULAR: Suboptimal contrast opacification of the pulmonary artery's. Main pulmonary artery is not enlarged. No pulmonary arterial filling defects to the level of the subsegmental branches. Heart size is normal, no right heart strain. Minimal coronary artery calcifications. No pericardial effusion. Thoracic aorta is normal course and caliber, trace calcific atherosclerosis. MEDIASTINUM/NODES: No lymphadenopathy by CT size criteria.Mildly patulous esophagus. LUNGS/PLEURA: Tracheobronchial tree is patent, no pneumothorax. No pleural effusions, focal consolidations, pulmonary masses. Stable 3 mm sub solid subpleural RIGHT lower lobe pulmonary nodule, no indicated follow-up. Dependent atelectasis. UPPER ABDOMEN: Included view of the abdomen is nonacute. 3.4 cm exophytic similar benign-appearing RIGHT renal cyst. MUSCULOSKELETAL:  Visualized soft tissues and included osseous structures are nonacute. Mild degenerative change of the thoracic spine. Severe C6-7 degenerative disc, incompletely assessed. Review of the MIP images confirms the above findings. IMPRESSION: No pulmonary embolism nor acute cardiopulmonary process. Electronically Signed   By: CElon AlasM.D.   On: 09/02/2016 01:17    echocardiogram  LV EF: 55% -   60%  ------------------------------------------------------------------- Indications:      Syncope 780.2.  ------------------------------------------------------------------- History:   Risk factors:  Dyslipidemia.  ------------------------------------------------------------------- Study Conclusions  - Left ventricle: The cavity size was normal. Systolic function was   normal. The estimated ejection fraction was in the range of 55%   to 60%. Wall motion was normal; there were no regional wall   motion abnormalities. Left ventricular diastolic function   parameters were normal. - Left atrium: The atrium was mildly dilated. - Atrial septum: No defect or patent foramen ovale was identified.       Filed Weights   09/02/16 0008 09/02/16 0308  Weight: 122.5 kg (270 lb) 125.6 kg (276 lb 14.4 oz)     Microbiology: No results found for this or any previous visit (from the past 240 hour(s)).     Blood Culture No results found for: SDES, [place] CULT, REPTSTATUS    Labs: Results for orders placed or performed during the hospital encounter of 09/02/16 (from the past 48 hour(s))  CBC with Differential/Platelet     Status: None   Collection Time: 09/02/16 12:03 AM  Result Value Ref Range  WBC 6.0 4.0 - 10.5 K/uL   RBC 5.29 4.22 - 5.81 MIL/uL   Hemoglobin 16.5 13.0 - 17.0 g/dL   HCT 48.5 39.0 - 52.0 %   MCV 91.7 78.0 - 100.0 fL   MCH 31.2 26.0 - 34.0 pg   MCHC 34.0 30.0 - 36.0 g/dL   RDW 13.7 11.5 - 15.5 %   Platelets 154 150 - 400 K/uL   Neutrophils Relative % 54 %    Neutro Abs 3.3 1.7 - 7.7 K/uL   Lymphocytes Relative 41 %   Lymphs Abs 2.4 0.7 - 4.0 K/uL   Monocytes Relative 3 %   Monocytes Absolute 0.2 0.1 - 1.0 K/uL   Eosinophils Relative 2 %   Eosinophils Absolute 0.1 0.0 - 0.7 K/uL   Basophils Relative 0 %   Basophils Absolute 0.0 0.0 - 0.1 K/uL  Brain natriuretic peptide     Status: None   Collection Time: 09/02/16 12:03 AM  Result Value Ref Range   B Natriuretic Peptide 8.4 0.0 - 100.0 pg/mL  Comprehensive metabolic panel     Status: Abnormal   Collection Time: 09/02/16 12:03 AM  Result Value Ref Range   Sodium 139 135 - 145 mmol/L   Potassium 3.5 3.5 - 5.1 mmol/L   Chloride 102 101 - 111 mmol/L   CO2 26 22 - 32 mmol/L   Glucose, Bld 133 (H) 65 - 99 mg/dL   BUN 19 6 - 20 mg/dL   Creatinine, Ser 1.35 (H) 0.61 - 1.24 mg/dL   Calcium 8.6 (L) 8.9 - 10.3 mg/dL   Total Protein 5.9 (L) 6.5 - 8.1 g/dL   Albumin 3.6 3.5 - 5.0 g/dL   AST 30 15 - 41 U/L   ALT 30 17 - 63 U/L   Alkaline Phosphatase 55 38 - 126 U/L   Total Bilirubin 0.4 0.3 - 1.2 mg/dL   GFR calc non Af Amer 59 (L) >60 mL/min   GFR calc Af Amer >60 >60 mL/min    Comment: (NOTE) The eGFR has been calculated using the CKD EPI equation. This calculation has not been validated in all clinical situations. eGFR's persistently <60 mL/min signify possible Chronic Kidney Disease.    Anion gap 11 5 - 15  Protime-INR     Status: None   Collection Time: 09/02/16 12:03 AM  Result Value Ref Range   Prothrombin Time 13.9 11.4 - 15.2 seconds   INR 1.06   Magnesium     Status: None   Collection Time: 09/02/16 12:03 AM  Result Value Ref Range   Magnesium 1.9 1.7 - 2.4 mg/dL  Ethanol     Status: None   Collection Time: 09/02/16 12:03 AM  Result Value Ref Range   Alcohol, Ethyl (B) <5 <5 mg/dL    Comment:        LOWEST DETECTABLE LIMIT FOR SERUM ALCOHOL IS 5 mg/dL FOR MEDICAL PURPOSES ONLY   I-stat chem 8, ed     Status: Abnormal   Collection Time: 09/02/16 12:24 AM  Result  Value Ref Range   Sodium 141 135 - 145 mmol/L   Potassium 3.5 3.5 - 5.1 mmol/L   Chloride 101 101 - 111 mmol/L   BUN 22 (H) 6 - 20 mg/dL   Creatinine, Ser 1.30 (H) 0.61 - 1.24 mg/dL   Glucose, Bld 132 (H) 65 - 99 mg/dL   Calcium, Ion 0.98 (L) 1.15 - 1.40 mmol/L   TCO2 26 0 - 100 mmol/L   Hemoglobin 16.3 13.0 -  17.0 g/dL   HCT 48.0 39.0 - 52.0 %  I-stat troponin, ED     Status: None   Collection Time: 09/02/16 12:24 AM  Result Value Ref Range   Troponin i, poc 0.00 0.00 - 0.08 ng/mL   Comment 3            Comment: Due to the release kinetics of cTnI, a negative result within the first hours of the onset of symptoms does not rule out myocardial infarction with certainty. If myocardial infarction is still suspected, repeat the test at appropriate intervals.   I-Stat CG4 Lactic Acid, ED     Status: Abnormal   Collection Time: 09/02/16 12:27 AM  Result Value Ref Range   Lactic Acid, Venous 2.53 (HH) 0.5 - 1.9 mmol/L   Comment NOTIFIED PHYSICIAN   Basic metabolic panel     Status: Abnormal   Collection Time: 09/02/16  3:25 AM  Result Value Ref Range   Sodium 137 135 - 145 mmol/L   Potassium 4.0 3.5 - 5.1 mmol/L   Chloride 105 101 - 111 mmol/L   CO2 23 22 - 32 mmol/L   Glucose, Bld 179 (H) 65 - 99 mg/dL   BUN 16 6 - 20 mg/dL   Creatinine, Ser 1.15 0.61 - 1.24 mg/dL   Calcium 8.5 (L) 8.9 - 10.3 mg/dL   GFR calc non Af Amer >60 >60 mL/min   GFR calc Af Amer >60 >60 mL/min    Comment: (NOTE) The eGFR has been calculated using the CKD EPI equation. This calculation has not been validated in all clinical situations. eGFR's persistently <60 mL/min signify possible Chronic Kidney Disease.    Anion gap 9 5 - 15  Lactic acid, plasma     Status: None   Collection Time: 09/02/16  5:13 AM  Result Value Ref Range   Lactic Acid, Venous 1.7 0.5 - 1.9 mmol/L  Glucose, capillary     Status: Abnormal   Collection Time: 09/02/16  6:21 AM  Result Value Ref Range   Glucose-Capillary 155  (H) 65 - 99 mg/dL   Comment 1 Notify RN   Urinalysis, Routine w reflex microscopic     Status: Abnormal   Collection Time: 09/02/16 11:22 AM  Result Value Ref Range   Color, Urine YELLOW YELLOW   APPearance CLEAR CLEAR   Specific Gravity, Urine 1.016 1.005 - 1.030   pH 6.0 5.0 - 8.0   Glucose, UA 50 (A) NEGATIVE mg/dL   Hgb urine dipstick NEGATIVE NEGATIVE   Bilirubin Urine NEGATIVE NEGATIVE   Ketones, ur NEGATIVE NEGATIVE mg/dL   Protein, ur NEGATIVE NEGATIVE mg/dL   Nitrite NEGATIVE NEGATIVE   Leukocytes, UA NEGATIVE NEGATIVE     Lipid Panel  No results found for: CHOL, TRIG, HDL, CHOLHDL, VLDL, LDLCALC, LDLDIRECT   No results found for: HGBA1C     HPI :   51 y.o.malewith medical history significant forhyperlipidemia and OSA who presents the emergency department after syncopal episode at home. He was laying in bed and had sudden onset of SOB. He then stood up because his breathing was so short. He instantly fell face first to the ground. Patient states he also had severe substernal CP without radiation. He did not wake up for several minutes. This occurred one time in the past 3 years ago and he was dx with vasovagal syncope CTA PE study was performed and negative for PE, and also negative for any acute cardiopulmonary disease. Patient was given another liter of normal  saline in the ED, had his Tdap updated, and had a small forehead laceration closed by the ED physician  HOSPITAL COURSE:    1. Syncope - Pt presents following syncopal episode upon standing; was preceded by acute SOB with epigastric pain and nausea; followed by hypotension per EMS report  - Head CT is a normal study; CTA chest negative for PE or any other cardiopulmonary disease  - EKG with sinus rhythm, no arrhythmia on cardiac monitoring   - He was given a 1 liter NS bolus en route to ED and has good BP on arrival  - Second liter of NS given in ED  - PE ruled-out with CTA chest - Has hx of similar a  few yrs ago and was given dx of vasovagal syncope  - The preceding epigastric discomfort and nausea suggests a neurally-mediated (vasovagal) syncope  - The occurrence upon standing suggests , orthostatics were checked in the ER after patient had received 2 L of fluid  , therefore they were negative  Echocardiogram within normal limits Recommend loop recorder/shoulder syncope recurs Endorsed  compliance to use CPAP qhs  May also benefit from outpatient cardiology evaluation and a stress test Ruled out for infectious etiology     2. Mild renal insufficiency - SCr is 1.35 on admission, up from 1.0 in 2015 with no more recent values available - Possibly an acute component given the reported hypotension PTA  - He was given 1 liter NS en route and a 2nd liter NS in ED  Creatinine stable at the time of discharge  4. Elevated lactic acid  - Lactic acid elevated to 2.53 initially  - No fever or leukocytosis - Likely secondary to dehydration and hypotension     5. Hyperlipidemia  - Continue Zocor    Discharge Exam:   Blood pressure 126/83, pulse 82, temperature 98.3 F (36.8 C), temperature source Oral, resp. rate (!) 22, height '6\' 3"'  (1.905 m), weight 125.6 kg (276 lb 14.4 oz), SpO2 98 %. Cardiovascular: S1 & S2 heard, regular rate and rhythm. No extremity edema. No significant JVD. Abdomen: No distension, no tenderness, no masses palpated. Bowel sounds normal.  Musculoskeletal: no clubbing / cyanosis. No joint deformity upper and lower extremities.   Skin: no significant rashes, lesions, ulcers. Warm, dry, well-perfused. Neurologic: CN 2-12 grossly intact. Sensation intact, DTR normal. Strength 5/5 in all 4 limbs.  Psychiatric: Alert and oriented x 3. Normal mood and affect.        SignedReyne Dumas 09/03/2016, 8:00 AM        Time spent >45 mins

## 2016-09-04 LAB — HIV ANTIBODY (ROUTINE TESTING W REFLEX): HIV SCREEN 4TH GENERATION: NONREACTIVE

## 2016-09-12 ENCOUNTER — Encounter: Payer: Self-pay | Admitting: *Deleted

## 2016-09-13 ENCOUNTER — Ambulatory Visit (INDEPENDENT_AMBULATORY_CARE_PROVIDER_SITE_OTHER): Payer: BLUE CROSS/BLUE SHIELD | Admitting: Cardiovascular Disease

## 2016-09-13 ENCOUNTER — Encounter: Payer: Self-pay | Admitting: *Deleted

## 2016-09-13 ENCOUNTER — Encounter: Payer: Self-pay | Admitting: Cardiovascular Disease

## 2016-09-13 VITALS — BP 143/100 | HR 79 | Ht 75.0 in | Wt 281.0 lb

## 2016-09-13 DIAGNOSIS — R55 Syncope and collapse: Secondary | ICD-10-CM | POA: Diagnosis not present

## 2016-09-13 DIAGNOSIS — Z9289 Personal history of other medical treatment: Secondary | ICD-10-CM

## 2016-09-13 NOTE — Patient Instructions (Signed)
Medication Instructions:  Continue all current medications.  Labwork: none  Testing/Procedures:  Your physician has requested that you have an exercise tolerance test. For further information please visit [email or web address]. Please also follow instruction sheet, as given.  Office will contact with results via phone or letter.    Follow-Up: 6 weeks  Any Other Special Instructions Will Be Listed Below (If Applicable).  If you need a refill on your cardiac medications before your next appointment, please call your pharmacy.

## 2016-09-13 NOTE — Progress Notes (Signed)
CARDIOLOGY CONSULT NOTE  Patient ID: Brad Luna MRN: [id number] DOB/AGE: 07/12/1965 51 y.o.  Admit date: (Not on file) Primary Physician: Caryl Bis, MD Referring Physician: Quillian Quince  Reason for Consultation: syncope  HPI: Brad Luna is a 51 y.o. male who is being seen today for the evaluation of syncope at the request of Caryl Bis, MD.   I have reviewed all relevant documentation from his PCP as well as all hospital documentation, labs, and studies.  He had apparently been lying in his bed and felt sudden shortness of breath. He stood up because of his shortness of breath and passed out. He had significant chest pain associated with this. CTA of the chest was negative for pulmonary embolism. He was given IV fluids. Head CT was also normal. Telemetry did not demonstrate any arrhythmias. There was some thought that this was vasovagal in etiology.  Troponin was normal. Lactic acid was elevated, 2.53. BUN 22, creatinine 1.3 on 4/28 with subsequent reduction to 16 and 1.15 respectively.  Echocardiogram demonstrated normal left ventricular systolic and diastolic function, LVEF 10-62%, with normal regional wall motion.  I personally interpreted ECG on 09/02/16 which showed normal sinus rhythm with no arrhythmias.  We had a very lengthy discussion. He works for the [employer] in [place]. He had passed out in 12-31-13 and went to the ED where he had a CT angiogram of the chest which was negative for acute cardiopulmonary disease. He said it was 110 and he was standing in the kitchen and passed out. He had been drinking 3 very large cups of coffee every night when working the night shift but has reduced this to one cup of coffee daily. He drinks decaffeinated tea and does not drink sodas.   He denies exertional chest pain and shortness of breath as well as fatigue. He used to [employer] working the night shift but quit smoking a few days ago.  He does  have sleep apnea.  He said during this most recent episode, he had been lying in bed and was not asleep. He then felt a "bloating sensation" in his chest which gradually increased. He then stood up fairly quickly and fell. He then crawled across the floor and hit his head and lost consciousness. His wife thinks he passed out for about 10 minutes but he is uncertain.  He said he has a high degree of anxiety. He said this is his third episode in the past 5 years.  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  He denies headaches and palpitations prior to passing out.  He does remember his blood pressure was 70/30 in the ambulance.  He denies orthopnea, leg swelling, and paroxysmal nocturnal dyspnea.    No Known Allergies  Current Outpatient Prescriptions  Medication Sig Dispense Refill  . buPROPion (WELLBUTRIN SR) 150 MG 12 hr tablet Take 150 mg by mouth every evening.     Marland Kitchen ibuprofen (ADVIL,MOTRIN) 200 MG tablet Take 800 mg by mouth every 6 (six) hours as needed for headache or moderate pain.    Marland Kitchen omeprazole (PRILOSEC OTC) 20 MG tablet Take 20 mg by mouth daily as needed (acid reflux).    . sildenafil (REVATIO) 20 MG tablet Take 100 mg by mouth daily as needed (erectile dysfunction).   3  . simvastatin (ZOCOR) 40 MG  tablet Take 40 mg by mouth at bedtime.  3   No current facility-administered medications for this visit.     Past Medical History:  Diagnosis Date  . High cholesterol   . Hypercholesteremia   . Sleep apnea     Past Surgical History:  Procedure Laterality Date  . COLONOSCOPY N/A 03/23/2016   Procedure: COLONOSCOPY;  Surgeon: Rogene Houston, MD;  Location: [facility];  Service: Endoscopy;  Laterality: N/A;  1030 - moved to 11/16 @ 10:30 - Ann notified pt  . HAND SURGERY Left   . KNEE SURGERY      Social History   Social History  . Marital status: Married     Spouse name: N/A  . Number of children: N/A  . Years of education: N/A   Occupational History  . Not on file.   Social History Main Topics  . Smoking status: Former Smoker    Packs/day: 1.00    Years: 39.00    Types: Cigarettes    Start date: 03/25/1978    Quit date: 09/01/2016  . Smokeless tobacco: Current User    Types: Snuff     Comment: smoked on and off/quit 10 years at a time/started dipping snuff 25 years ago  . Alcohol use Yes     Comment: Occ  . Drug use: No  . Sexual activity: Not on file   Other Topics Concern  . Not on file   Social History Narrative  . No narrative on file     No family history of premature CAD in 1st degree relatives.  Current Meds  Medication Sig  . buPROPion (WELLBUTRIN SR) 150 MG 12 hr tablet Take 150 mg by mouth every evening.   Marland Kitchen ibuprofen (ADVIL,MOTRIN) 200 MG tablet Take 800 mg by mouth every 6 (six) hours as needed for headache or moderate pain.  Marland Kitchen omeprazole (PRILOSEC OTC) 20 MG tablet Take 20 mg by mouth daily as needed (acid reflux).  . sildenafil (REVATIO) 20 MG tablet Take 100 mg by mouth daily as needed (erectile dysfunction).   . simvastatin (ZOCOR) 40 MG tablet Take 40 mg by mouth at bedtime.      Review of systems complete and found to be negative unless listed above in HPI    Physical exam Blood pressure (!) 143/100, pulse 79, height 6\' 3"  (1.905 m), weight 281 lb (127.5 kg). General: NAD Neck: No JVD, no thyromegaly or thyroid nodule.  Lungs: Clear to auscultation bilaterally with normal respiratory effort. CV: Nondisplaced PMI. Regular rate and rhythm, normal S1/S2, no S3/S4, no murmur.  No peripheral edema.  No carotid bruit.  Varicose veins in both legs. Abdomen: Soft, nontender, obese, no distention.  Skin: Intact without lesions or rashes.  Neurologic: Alert and oriented x 3.  Psych: Normal affect. Extremities: No clubbing or cyanosis.  HEENT: Normal.   ECG: Most recent ECG reviewed.   Labs: Lab  Results  Component Value Date/Time   K 4.0 09/02/2016 03:25 AM   BUN 16 09/02/2016 03:25 AM   CREATININE 1.15 09/02/2016 03:25 AM   ALT 30 09/02/2016 12:03 AM   HGB 16.3 09/02/2016 12:24 AM     Lipids: No results found for: LDLCALC, LDLDIRECT, CHOL, TRIG, HDL      ASSESSMENT AND PLAN:  1. Syncope: This is of uncertain etiology. It does not sound ischemic. It also does not sound arrhythmic. It may be vasovagal. I will obtain an exercise treadmill stress test to look for hemodynamic response to exercise  as well as for exercise induced ECG changes. I will obtain a copy of the stress test performed at [facility] in 2015. It sounds like the episode in July 2015 was vasovagal and likely due to heat, humidity, and dehydration. I would consider an implantable loop recorder if there were frequent recurrences and something pointing to an arrhythmic etiology.  Disposition: Follow up in 6 wks  Signed: Kate Sable, M.D., F.A.C.C.  09/13/2016, 9:35 AM

## 2016-09-18 ENCOUNTER — Ambulatory Visit (HOSPITAL_COMMUNITY)
Admission: RE | Admit: 2016-09-18 | Discharge: 2016-09-18 | Disposition: A | Discharge status: Home / Self Care | Payer: BLUE CROSS/BLUE SHIELD | Source: Ambulatory Visit | Attending: Cardiovascular Disease | Admitting: Cardiovascular Disease

## 2016-09-18 DIAGNOSIS — R55 Syncope and collapse: Secondary | ICD-10-CM

## 2016-09-18 LAB — EXERCISE TOLERANCE TEST
CHL CUP RESTING HR STRESS: 83 {beats}/min
CHL RATE OF PERCEIVED EXERTION: 13
CSEPED: 8 min
CSEPEDS: 31 s
CSEPEW: 10.1 METS
MPHR: 168 {beats}/min
Peak HR: 144 {beats}/min
Percent HR: 85 %

## 2016-09-20 ENCOUNTER — Telehealth: Payer: Self-pay | Admitting: *Deleted

## 2016-09-20 NOTE — Telephone Encounter (Signed)
Notes recorded by Laurine Blazer, LPN on 7/34/2876 at 8:11 PM EDT Patient notified. Copy to pmd. Follow up scheduled for 10/30/2016 with Dr. Danielle Dess. ------  Notes recorded by Herminio Commons, MD on 09/18/2016 at 5:20 PM EDT Normal. BP elevated with exercise.

## 2016-10-30 ENCOUNTER — Ambulatory Visit (INDEPENDENT_AMBULATORY_CARE_PROVIDER_SITE_OTHER): Payer: BLUE CROSS/BLUE SHIELD | Admitting: Cardiovascular Disease

## 2016-10-30 ENCOUNTER — Encounter: Payer: Self-pay | Admitting: Cardiovascular Disease

## 2016-10-30 VITALS — BP 137/90 | HR 89 | Ht 75.0 in | Wt 281.0 lb

## 2016-10-30 DIAGNOSIS — I10 Essential (primary) hypertension: Secondary | ICD-10-CM

## 2016-10-30 DIAGNOSIS — R55 Syncope and collapse: Secondary | ICD-10-CM

## 2016-10-30 NOTE — Patient Instructions (Signed)
Medication Instructions:  Continue all current medications.  Labwork: none  Testing/Procedures: none  Follow-Up: As needed.    Any Other Special Instructions Will Be Listed Below (If Applicable).  If you need a refill on your cardiac medications before your next appointment, please call your pharmacy.  

## 2016-10-30 NOTE — Progress Notes (Signed)
SUBJECTIVE: The patient presents for follow-up of syncope. Exercise treadmill stress test was normal with no evidence of ischemia. There was a low Duke treadmill score predicting a low risk of cardiac events. There was a hypertensive response to exercise.  He has had no further recurrences. He quit smoking. He feels well and denies chest pain, palpitations, shortness of breath, and dizziness. He attributes his most recent episode of syncope 2 dehydration, exhaustion, and severe anxiety and stress.   Review of Systems: As per "subjective", otherwise negative.  No Known Allergies  Current Outpatient Prescriptions  Medication Sig Dispense Refill  . buPROPion (WELLBUTRIN SR) 150 MG 12 hr tablet Take 150 mg by mouth every evening.     Marland Kitchen doxycycline (VIBRAMYCIN) 100 MG capsule TAKE 1 CAPSULE BY MOUTH TWICE A DAY FOR INFECTION  0  . ibuprofen (ADVIL,MOTRIN) 200 MG tablet Take 800 mg by mouth every 6 (six) hours as needed for headache or moderate pain.    Marland Kitchen omeprazole (PRILOSEC OTC) 20 MG tablet Take 20 mg by mouth daily as needed (acid reflux).    . sildenafil (REVATIO) 20 MG tablet Take 100 mg by mouth daily as needed (erectile dysfunction).   3  . simvastatin (ZOCOR) 40 MG tablet Take 40 mg by mouth at bedtime.  3  . ZYLET 0.5-0.3 % SUSP   0   No current facility-administered medications for this visit.     Past Medical History:  Diagnosis Date  . High cholesterol   . Hypercholesteremia   . Sleep apnea     Past Surgical History:  Procedure Laterality Date  . COLONOSCOPY N/A 03/23/2016   Procedure: COLONOSCOPY;  Surgeon: Rogene Houston, MD;  Location: [facility];  Service: Endoscopy;  Laterality: N/A;  1030 - moved to 11/16 @ 10:30 - Ann notified pt  . HAND SURGERY Left   . KNEE SURGERY      Social History   Social History  . Marital status: Married    Spouse name: N/A  . Number of children: N/A  . Years of education: N/A   Occupational History  . Not on file.    Social History Main Topics  . Smoking status: Former Smoker    Packs/day: 1.00    Years: 39.00    Types: Cigarettes    Start date: 03/25/1978    Quit date: 09/01/2016  . Smokeless tobacco: Current User    Types: Snuff     Comment: smoked on and off/quit 10 years at a time/started dipping snuff 25 years ago  . Alcohol use Yes     Comment: Occ  . Drug use: No  . Sexual activity: Not on file   Other Topics Concern  . Not on file   Social History Narrative  . No narrative on file     Vitals:   10/30/16 1536  BP: 137/90  Pulse: 89  SpO2: 98%  Weight: 281 lb (127.5 kg)  Height: 6\' 3"  (1.905 m)    Wt Readings from Last 3 Encounters:  10/30/16 281 lb (127.5 kg)  09/13/16 281 lb (127.5 kg)  09/02/16 276 lb 14.4 oz (125.6 kg)     PHYSICAL EXAM General: NAD HEENT: Normal. Neck: No JVD, no thyromegaly. Lungs: Clear to auscultation bilaterally with normal respiratory effort. CV: Nondisplaced PMI.  Regular rate and rhythm, normal S1/S2, no S3/S4, no murmur. No pretibial or periankle edema.   Abdomen: Soft, nontender, no distention.  Neurologic: Alert and oriented.  Psych: Normal  affect. Skin: Normal. Musculoskeletal: No gross deformities.    ECG: Most recent ECG reviewed.   Labs: Lab Results  Component Value Date/Time   K 4.0 09/02/2016 03:25 AM   BUN 16 09/02/2016 03:25 AM   CREATININE 1.15 09/02/2016 03:25 AM   ALT 30 09/02/2016 12:03 AM   HGB 16.3 09/02/2016 12:24 AM     Lipids: No results found for: LDLCALC, LDLDIRECT, CHOL, TRIG, HDL     ASSESSMENT AND PLAN: 1. Syncope: No recurrences.  Exercise treadmill stress test was normal with no evidence of ischemia. There was a low Duke treadmill score predicting a low risk of cardiac events. There was a hypertensive response to exercise. May have been vasovagal in etiology. I would consider an implantable loop recorder if there were frequent recurrences and something pointing to an arrhythmic etiology.  2.  Hypertension: He has stage I hypertension by most recent guidelines. This will need to be monitored by PCP to determine if antihypertensive therapy is indicated.    Disposition: Follow up prn.  Kate Sable, M.D., F.A.C.C.

## 2018-10-30 ENCOUNTER — Other Ambulatory Visit: Payer: Self-pay

## 2018-10-30 ENCOUNTER — Other Ambulatory Visit: Payer: Self-pay | Admitting: Internal Medicine

## 2018-10-30 DIAGNOSIS — Z20822 Contact with and (suspected) exposure to covid-19: Secondary | ICD-10-CM

## 2018-10-31 IMAGING — CT CT ANGIO CHEST
2 of 8 series · 18 of 46 positions shown · IV contrast (iopamidol)
Comparison: Chest CT November 21, 2013

CLINICAL DATA: Shortness of breath and chest pain, fell on floor.
Hypotensive and hypoxic.

EXAM:
CT ANGIOGRAPHY CHEST WITH CONTRAST
TECHNIQUE: Multidetector CT imaging of the chest was performed using the
standard protocol during bolus administration of intravenous
contrast. Multiplanar CT image reconstructions and MIPs were
obtained to evaluate the vascular anatomy.
CONTRAST:  100mL [id number]-[id number] IOPAMIDOL (T1Z13I-EWW) INJECTION 61%

[Series 6: thins · axial · 0.81mm/px · z∈[+1006,+1261]mm · 15 of 281 slices shown]
[im 13/281  lung]
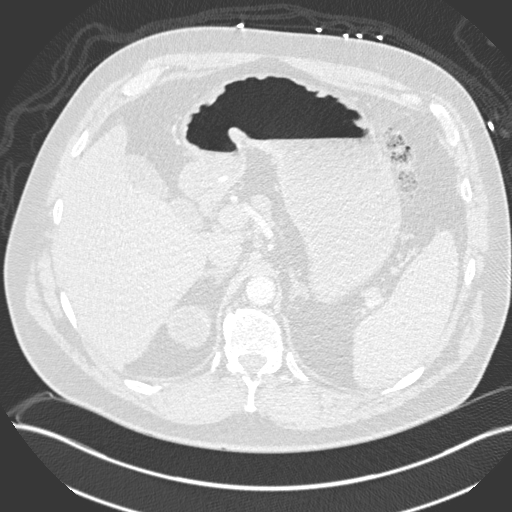
[im 39/281  soft-tissue]
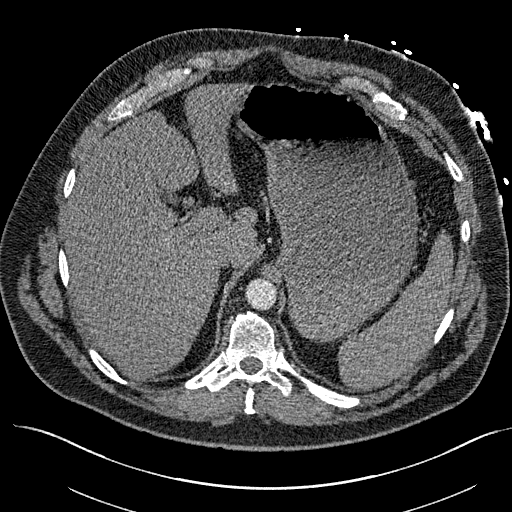
[im 51/281  lung]
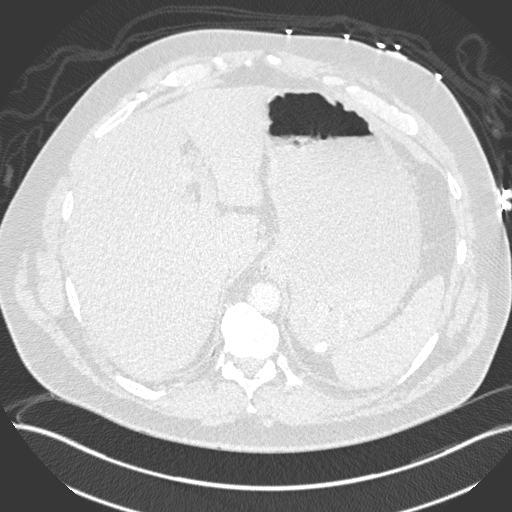
[im 64/281  soft-tissue]
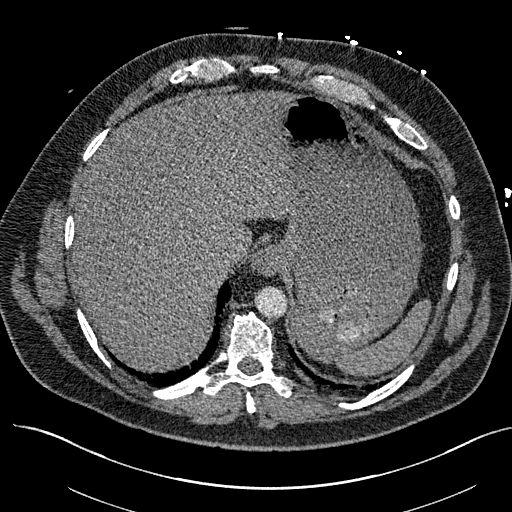
[im 90/281  lung]
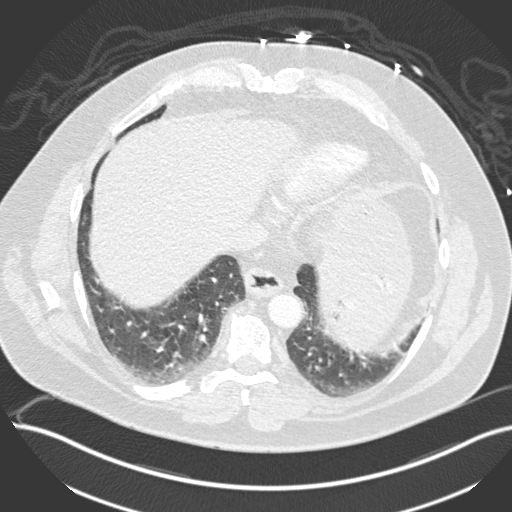
[im 102/281  soft-tissue]
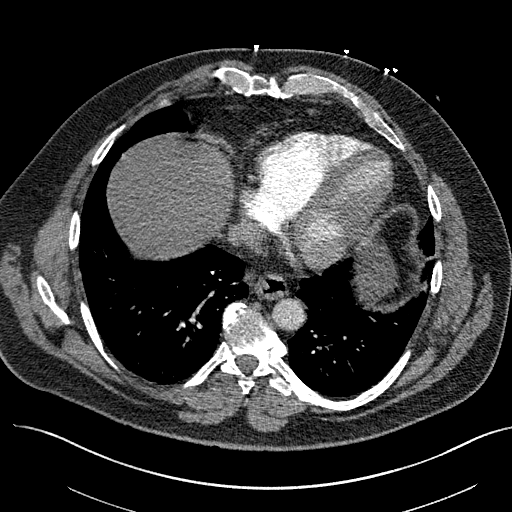
[im 128/281  lung]
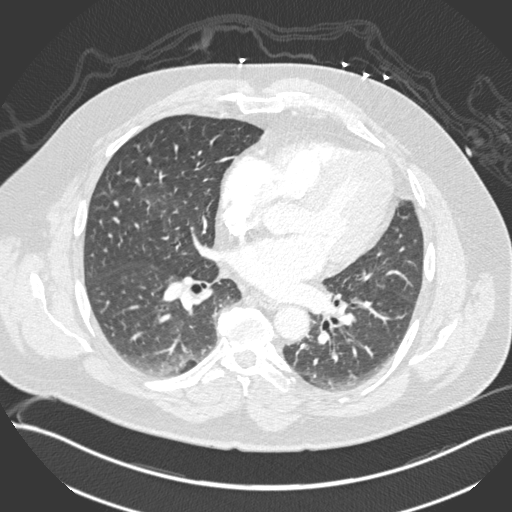
[im 141/281  soft-tissue]
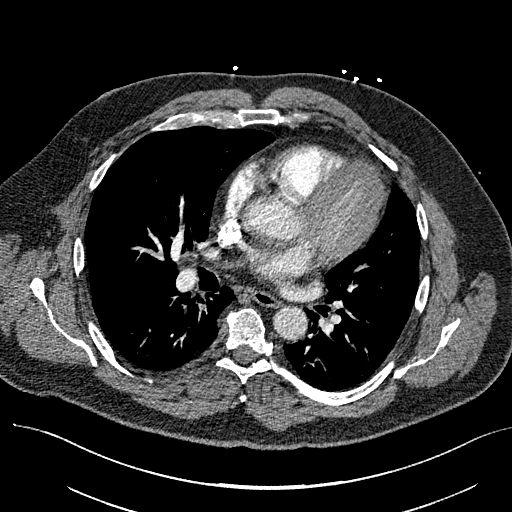
[im 153/281  lung]
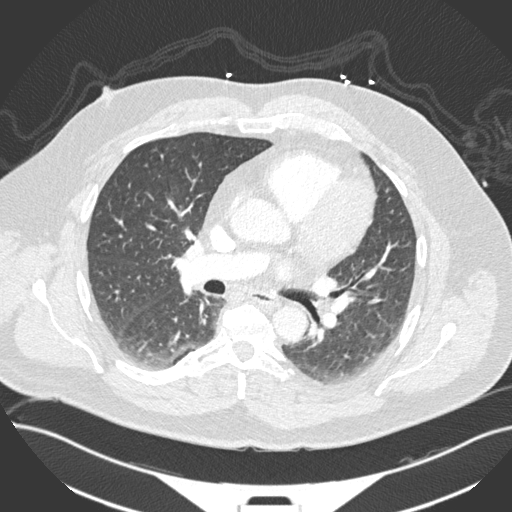
[im 179/281  soft-tissue]
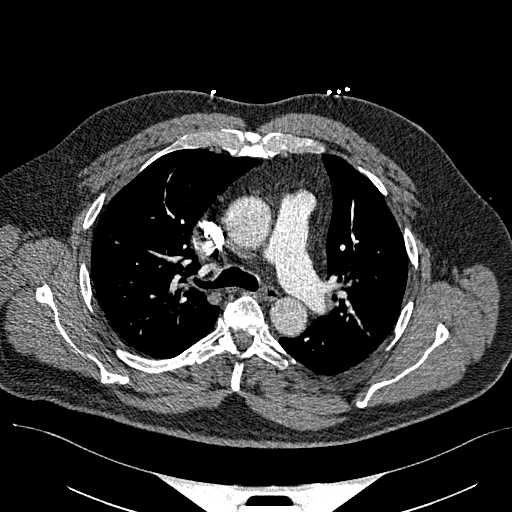
[im 191/281  lung]
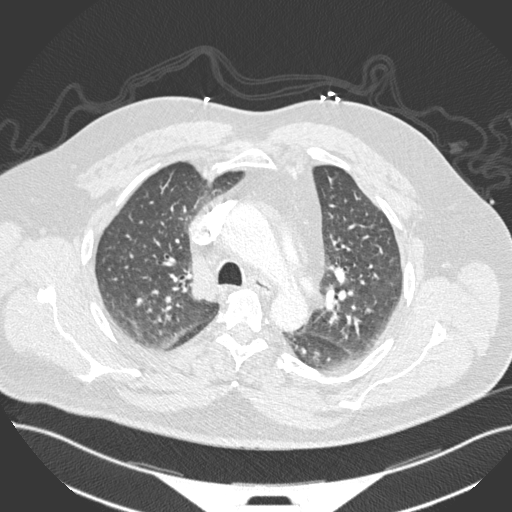
[im 217/281  soft-tissue]
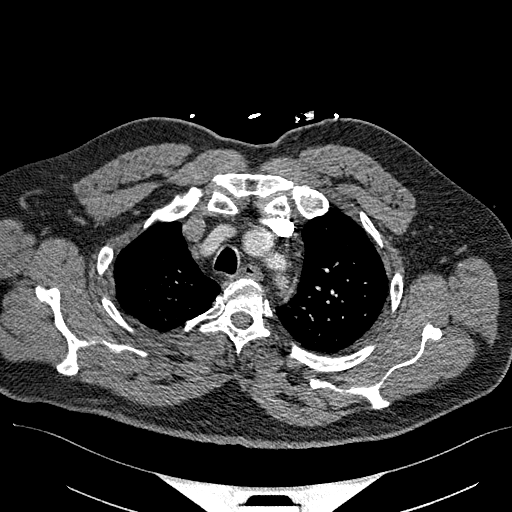
[im 230/281  lung]
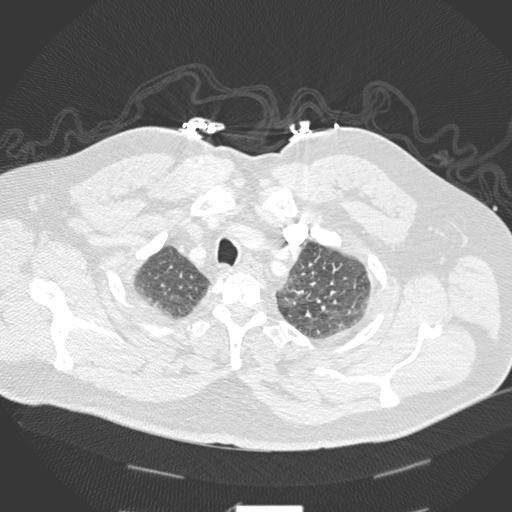
[im 242/281  soft-tissue]
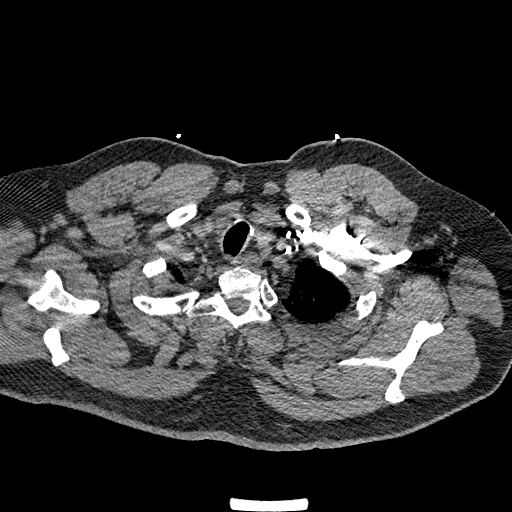
[im 268/281  lung]
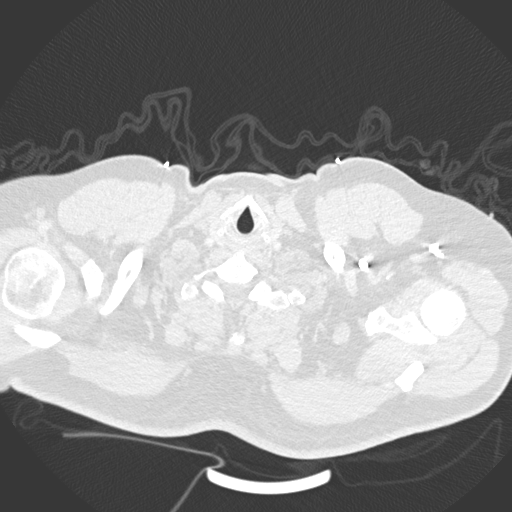

[Series 8: coronal mpr · coronal · 0.57mm/px · 3 of 159 slices shown]
[im 40/159  soft-tissue]
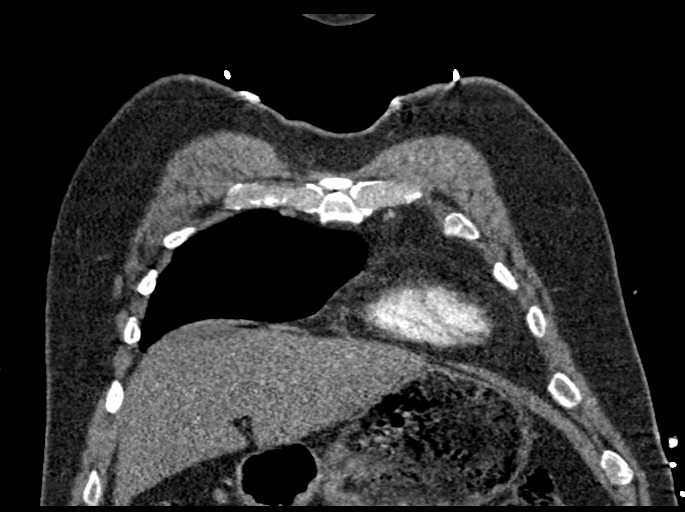
[im 80/159  soft-tissue]
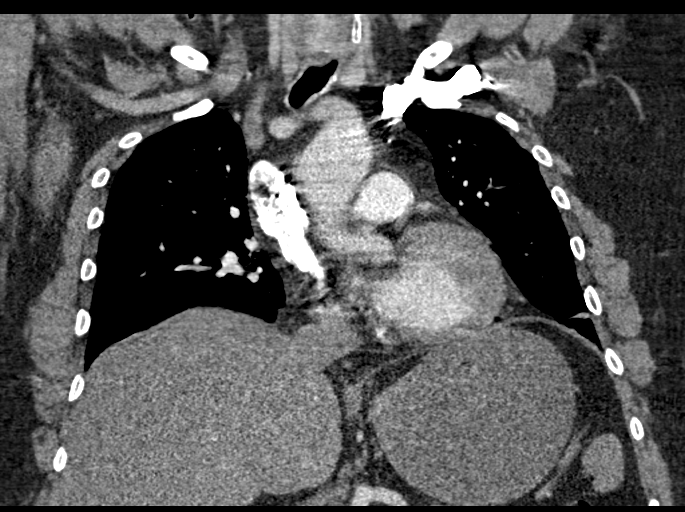
[im 119/159  soft-tissue]
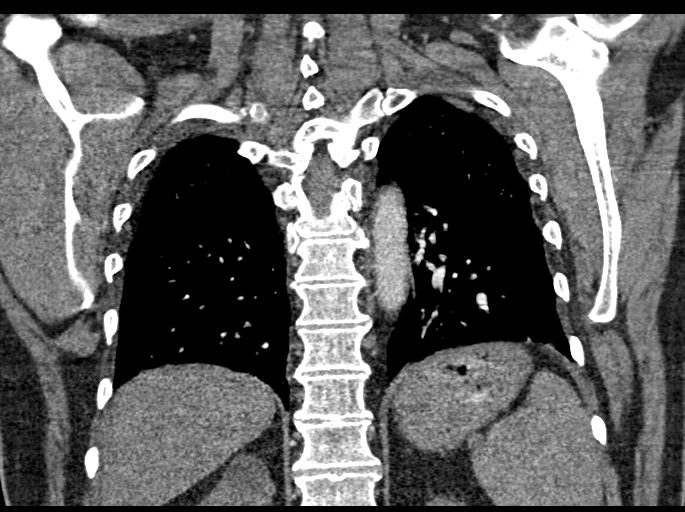

[18 of 46 positions shown; findings below may reference images not displayed]

FINDINGS: CARDIOVASCULAR: Suboptimal contrast opacification of the pulmonary
artery's. Main pulmonary artery is not enlarged. No pulmonary
arterial filling defects to the level of the subsegmental branches.
Heart size is normal, no right heart strain. Minimal coronary artery
calcifications. No pericardial effusion. Thoracic aorta is normal
course and caliber, trace calcific atherosclerosis.

MEDIASTINUM/NODES: No lymphadenopathy by CT size criteria.Mildly
patulous esophagus.

LUNGS/PLEURA: Tracheobronchial tree is patent, no pneumothorax. No
pleural effusions, focal consolidations, pulmonary masses. Stable 3
mm sub solid subpleural RIGHT lower lobe pulmonary nodule, no
indicated follow-up. Dependent atelectasis.

UPPER ABDOMEN: Included view of the abdomen is nonacute. 3.4 cm
exophytic similar benign-appearing RIGHT renal cyst.

MUSCULOSKELETAL: Visualized soft tissues and included osseous
structures are nonacute. Mild degenerative change of the thoracic
spine. Severe C6-7 degenerative disc, incompletely assessed.

Review of the MIP images confirms the above findings.
IMPRESSION: No pulmonary embolism nor acute cardiopulmonary process.

## 2018-10-31 IMAGING — CT CT HEAD W/O CM
3 of 4 series · 14 of 47 positions shown, 16 images · non-contrast
Comparison: Head CT dated 11/21/2013

CLINICAL DATA: 50-year-old male with syncope.

EXAM:
CT HEAD WITHOUT CONTRAST
TECHNIQUE: Contiguous axial images were obtained from the base of the skull
through the vertex without intravenous contrast.

[Series 4: head 2.0 h70h · axial · 0.46mm/px · z∈[+1394,+1520]mm · 8 of 79 slices shown, 10 images]
[im 8/79  brain]
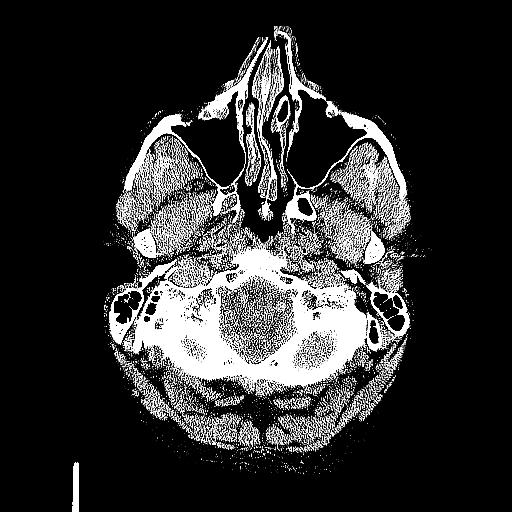
[im 8/79  bone]
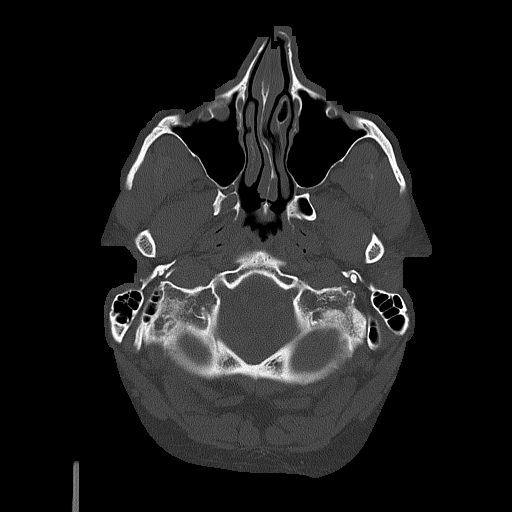
[im 16/79  brain]
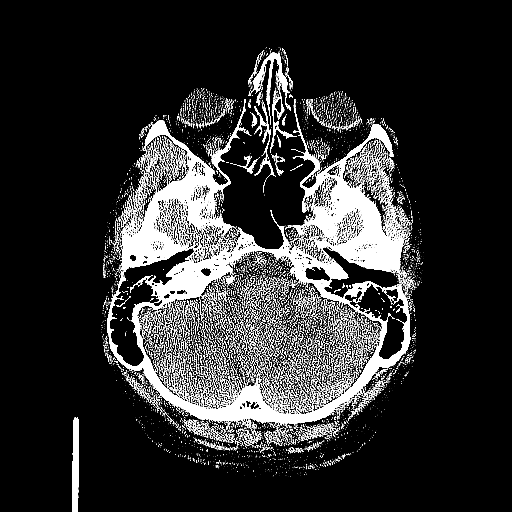
[im 24/79  brain]
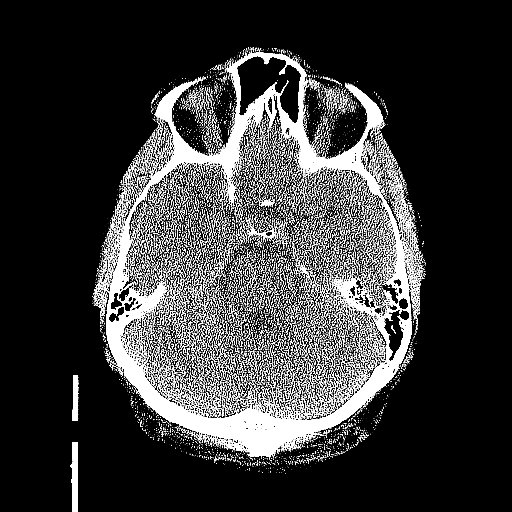
[im 36/79  brain]
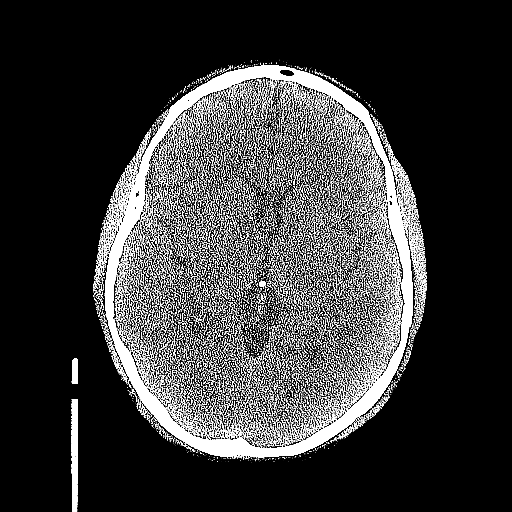
[im 43/79  brain]
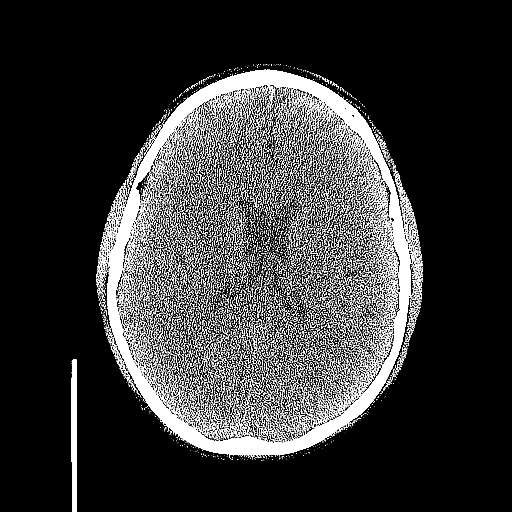
[im 43/79  bone]
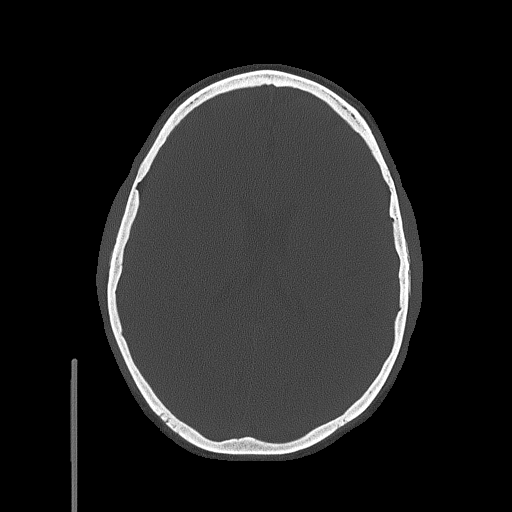
[im 55/79  brain]
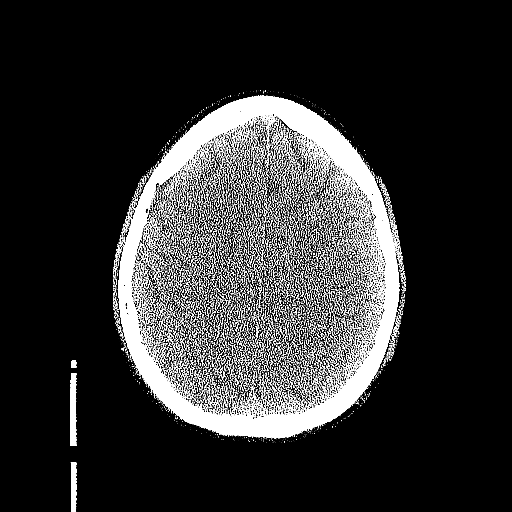
[im 63/79  brain]
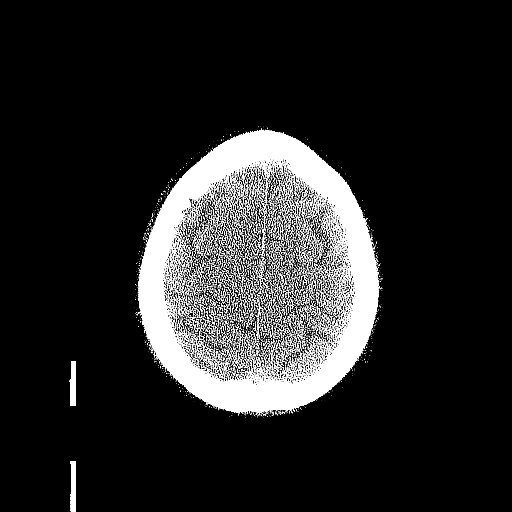
[im 71/79  brain]
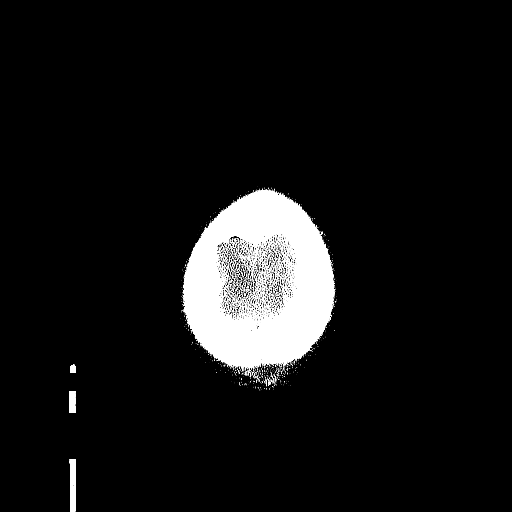

[Series 5: head 3.0 mpr cor · coronal · 0.31mm/px · 3 of 70 slices shown]
[im 24/70  brain]
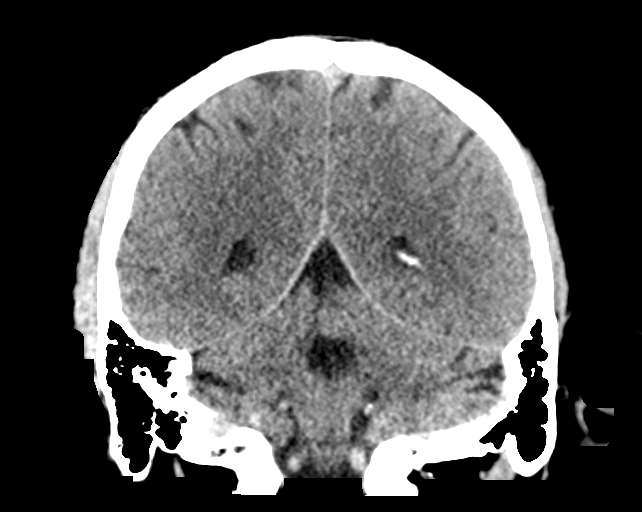
[im 31/70  brain]
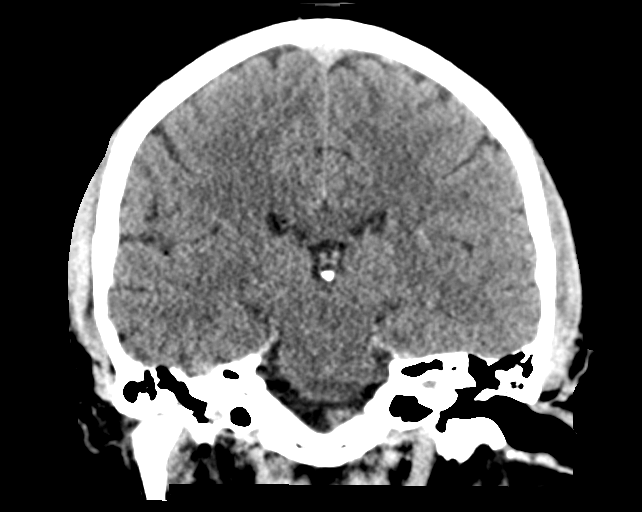
[im 39/70  brain]
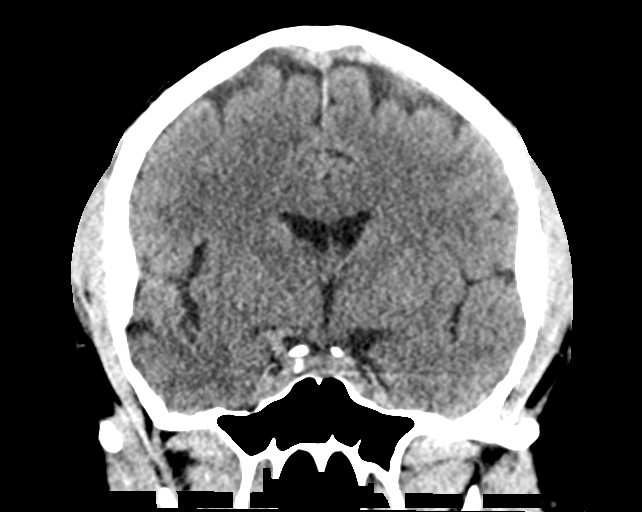

[Series 6: head 3.0 mpr sag · sagittal · 0.31mm/px · 3 of 61 slices shown]
[im 21/61  brain]
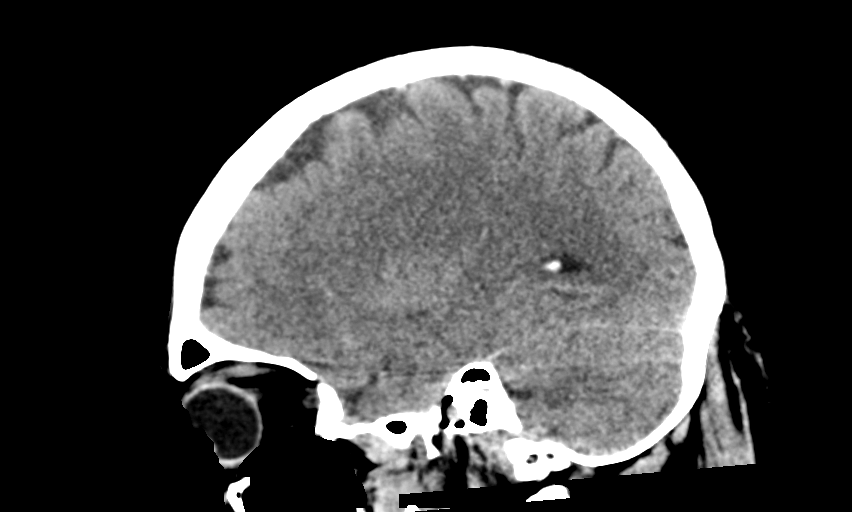
[im 31/61  brain]
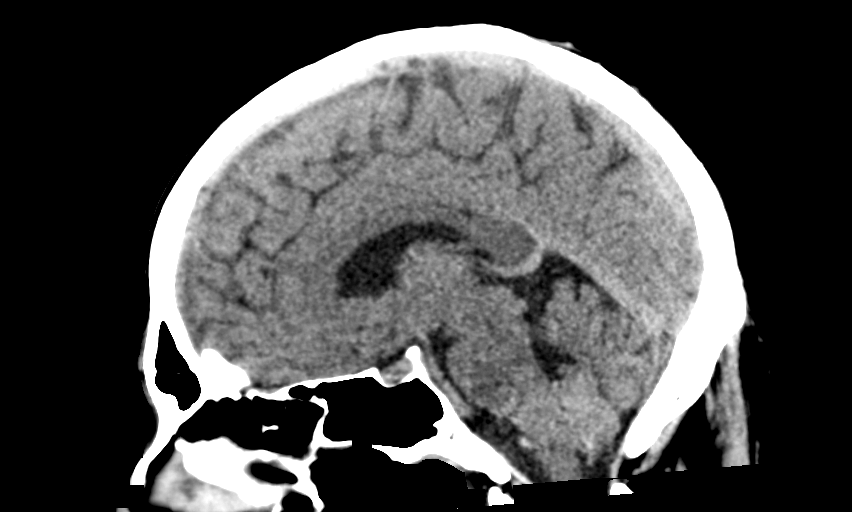
[im 41/61  brain]
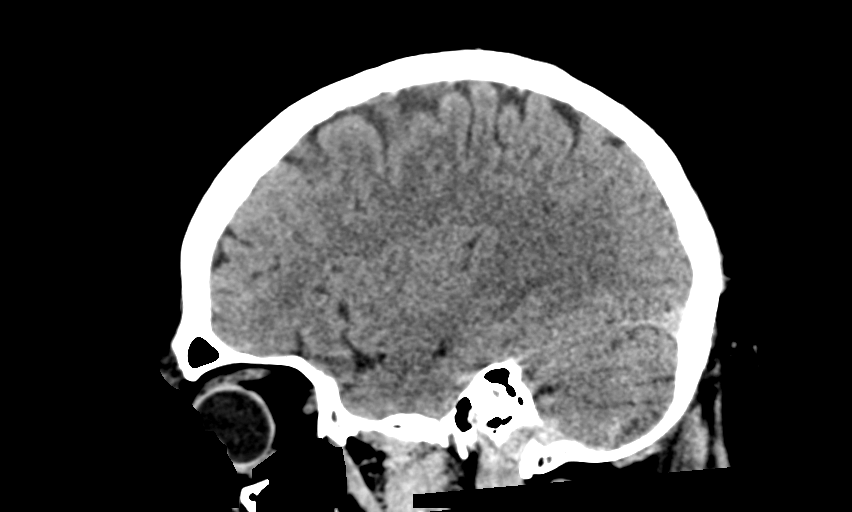

[14 of 47 positions shown; findings below may reference images not displayed]

FINDINGS: Brain: No evidence of acute infarction, hemorrhage, hydrocephalus,
extra-axial collection or mass lesion/mass effect.

Vascular: No hyperdense vessel or unexpected calcification.

Skull: Normal. Negative for fracture or focal lesion.

Sinuses/Orbits: The visualized paranasal sinuses and mastoid air
cells are clear. There is a small right maxillary sinus retention
cyst or polyp.

Other: None
IMPRESSION: No acute intracranial pathology.  Normal unenhanced CT of the brain.

## 2018-11-03 LAB — NOVEL CORONAVIRUS, NAA: SARS-CoV-2, NAA: NOT DETECTED

## 2019-02-03 ENCOUNTER — Other Ambulatory Visit: Payer: Self-pay

## 2019-02-03 DIAGNOSIS — Z20822 Contact with and (suspected) exposure to covid-19: Secondary | ICD-10-CM

## 2019-02-04 LAB — SPECIMEN STATUS REPORT

## 2019-02-04 LAB — NOVEL CORONAVIRUS, NAA: SARS-CoV-2, NAA: DETECTED — AB

## 2019-02-05 ENCOUNTER — Telehealth: Payer: Self-pay | Admitting: *Deleted

## 2019-02-05 NOTE — Telephone Encounter (Signed)
See result Note.   Let him know his COVID-19 test result was positive.

## 2019-02-11 ENCOUNTER — Other Ambulatory Visit: Payer: Self-pay

## 2019-02-11 DIAGNOSIS — Z20822 Contact with and (suspected) exposure to covid-19: Secondary | ICD-10-CM

## 2019-02-14 LAB — NOVEL CORONAVIRUS, NAA: SARS-CoV-2, NAA: DETECTED — AB

## 2023-03-02 ENCOUNTER — Encounter (INDEPENDENT_AMBULATORY_CARE_PROVIDER_SITE_OTHER): Payer: Self-pay | Admitting: *Deleted
